# Patient Record
Sex: Female | Born: 1945 | Race: White | Hispanic: No | Smoking: Never smoker
Health system: Southern US, Community
[De-identification: ages and names within clinical notes are randomized; demographics above are authoritative.]

## PROBLEM LIST (undated history)

## (undated) DIAGNOSIS — Z8719 Personal history of other diseases of the digestive system: Secondary | ICD-10-CM

## (undated) DIAGNOSIS — M1990 Unspecified osteoarthritis, unspecified site: Secondary | ICD-10-CM

## (undated) DIAGNOSIS — J189 Pneumonia, unspecified organism: Secondary | ICD-10-CM

## (undated) DIAGNOSIS — R011 Cardiac murmur, unspecified: Secondary | ICD-10-CM

## (undated) DIAGNOSIS — G4730 Sleep apnea, unspecified: Secondary | ICD-10-CM

## (undated) DIAGNOSIS — I10 Essential (primary) hypertension: Secondary | ICD-10-CM

## (undated) DIAGNOSIS — Z8489 Family history of other specified conditions: Secondary | ICD-10-CM

## (undated) DIAGNOSIS — E119 Type 2 diabetes mellitus without complications: Secondary | ICD-10-CM

## (undated) HISTORY — PX: COLONOSCOPY: SHX174

## (undated) HISTORY — PX: APPENDECTOMY: SHX54

## (undated) HISTORY — PX: DILATION AND CURETTAGE OF UTERUS: SHX78

## (undated) HISTORY — PX: BREAST SURGERY: SHX581

---

## 2015-05-07 ENCOUNTER — Other Ambulatory Visit: Payer: Self-pay | Admitting: Orthopedic Surgery

## 2015-05-28 ENCOUNTER — Encounter (HOSPITAL_COMMUNITY)
Admission: RE | Admit: 2015-05-28 | Discharge: 2015-05-28 | Disposition: A | Discharge status: Home / Self Care | Payer: Medicare Other | Source: Ambulatory Visit | Attending: Orthopedic Surgery | Admitting: Orthopedic Surgery

## 2015-05-28 ENCOUNTER — Encounter (HOSPITAL_COMMUNITY): Payer: Self-pay

## 2015-05-28 ENCOUNTER — Ambulatory Visit (HOSPITAL_COMMUNITY)
Admission: RE | Admit: 2015-05-28 | Discharge: 2015-05-28 | Disposition: A | Discharge status: Home / Self Care | Payer: Medicare Other | Source: Ambulatory Visit | Attending: Orthopedic Surgery | Admitting: Orthopedic Surgery

## 2015-05-28 DIAGNOSIS — Z7984 Long term (current) use of oral hypoglycemic drugs: Secondary | ICD-10-CM | POA: Diagnosis not present

## 2015-05-28 DIAGNOSIS — M1711 Unilateral primary osteoarthritis, right knee: Secondary | ICD-10-CM | POA: Insufficient documentation

## 2015-05-28 DIAGNOSIS — M1710 Unilateral primary osteoarthritis, unspecified knee: Secondary | ICD-10-CM

## 2015-05-28 DIAGNOSIS — Z01812 Encounter for preprocedural laboratory examination: Secondary | ICD-10-CM | POA: Diagnosis not present

## 2015-05-28 DIAGNOSIS — Z0183 Encounter for blood typing: Secondary | ICD-10-CM | POA: Diagnosis not present

## 2015-05-28 DIAGNOSIS — Z7982 Long term (current) use of aspirin: Secondary | ICD-10-CM | POA: Insufficient documentation

## 2015-05-28 DIAGNOSIS — Z79899 Other long term (current) drug therapy: Secondary | ICD-10-CM | POA: Diagnosis not present

## 2015-05-28 DIAGNOSIS — Z01818 Encounter for other preprocedural examination: Secondary | ICD-10-CM | POA: Diagnosis present

## 2015-05-28 DIAGNOSIS — M179 Osteoarthritis of knee, unspecified: Secondary | ICD-10-CM

## 2015-05-28 HISTORY — DX: Cardiac murmur, unspecified: R01.1

## 2015-05-28 HISTORY — DX: Unspecified osteoarthritis, unspecified site: M19.90

## 2015-05-28 HISTORY — DX: Personal history of other diseases of the digestive system: Z87.19

## 2015-05-28 HISTORY — DX: Essential (primary) hypertension: I10

## 2015-05-28 HISTORY — DX: Sleep apnea, unspecified: G47.30

## 2015-05-28 HISTORY — DX: Type 2 diabetes mellitus without complications: E11.9

## 2015-05-28 LAB — COMPREHENSIVE METABOLIC PANEL
ALT: 30 U/L (ref 14–54)
ANION GAP: 10 (ref 5–15)
AST: 28 U/L (ref 15–41)
Albumin: 4.1 g/dL (ref 3.5–5.0)
Alkaline Phosphatase: 72 U/L (ref 38–126)
BILIRUBIN TOTAL: 0.5 mg/dL (ref 0.3–1.2)
BUN: 20 mg/dL (ref 6–20)
CO2: 26 mmol/L (ref 22–32)
Calcium: 10 mg/dL (ref 8.9–10.3)
Chloride: 106 mmol/L (ref 101–111)
Creatinine, Ser: 0.78 mg/dL (ref 0.44–1.00)
Glucose, Bld: 112 mg/dL — ABNORMAL HIGH (ref 65–99)
POTASSIUM: 4.3 mmol/L (ref 3.5–5.1)
Sodium: 142 mmol/L (ref 135–145)
TOTAL PROTEIN: 7 g/dL (ref 6.5–8.1)

## 2015-05-28 LAB — TYPE AND SCREEN
ABO/RH(D): O POS
Antibody Screen: NEGATIVE

## 2015-05-28 LAB — CBC
HEMATOCRIT: 47.6 % — AB (ref 36.0–46.0)
Hemoglobin: 15.7 g/dL — ABNORMAL HIGH (ref 12.0–15.0)
MCH: 29 pg (ref 26.0–34.0)
MCHC: 33 g/dL (ref 30.0–36.0)
MCV: 87.8 fL (ref 78.0–100.0)
Platelets: 184 10*3/uL (ref 150–400)
RBC: 5.42 MIL/uL — ABNORMAL HIGH (ref 3.87–5.11)
RDW: 13.7 % (ref 11.5–15.5)
WBC: 5.5 10*3/uL (ref 4.0–10.5)

## 2015-05-28 LAB — SURGICAL PCR SCREEN
MRSA, PCR: NEGATIVE
Staphylococcus aureus: NEGATIVE

## 2015-05-28 LAB — ABO/RH: ABO/RH(D): O POS

## 2015-05-28 LAB — PROTIME-INR
INR: 1.06 (ref 0.00–1.49)
Prothrombin Time: 14 seconds (ref 11.6–15.2)

## 2015-05-28 LAB — APTT: aPTT: 28 seconds (ref 24–37)

## 2015-05-28 LAB — GLUCOSE, CAPILLARY: GLUCOSE-CAPILLARY: 96 mg/dL (ref 65–99)

## 2015-05-28 NOTE — Pre-Procedure Instructions (Signed)
    Natalie PaciniSuzanne C Whitney  05/28/2015      [facility] [place] - [address] [place] [id number] Phone: [phone] Fax: [phone]    Your procedure is scheduled on 06/06/15.  [facility].M[facility]  Call this number if you have problems the morning of surgery:  [phone]   Remember:  Do not eat food or drink liquids after midnight.  Take these medicines the morning of surgery with A SIP OF WATER --none   Do not wear jewelry, make-up or nail polish.  Do not wear lotions, powders, or perfumes.  You may wear deodorant.  Do not shave 48 hours prior to surgery.  Men may shave face and neck.  Do not bring valuables to the hospital.  [facility] is not responsible for any belongings or valuables.  Contacts, dentures or bridgework may not be worn into surgery.  Leave your suitcase in the car.  After surgery it may be brought to your room.  For patients admitted to the hospital, discharge time will be determined by your treatment team.  Patients discharged the day of surgery will not be allowed to drive home.   Name and phone number of your driver:    Special instructions:   Please read over the following fact sheets that you were given. Pain Booklet, Coughing and Deep Breathing, Blood Transfusion Information, MRSA Information and Surgical Site Infection Prevention

## 2015-05-29 NOTE — Progress Notes (Addendum)
Anesthesia Chart Review:  Pt is a 70 year old female scheduled for R total knee arthroplasty on 06/06/2015 with Dr. Lajoyce Cornersuda.   Cardiologist is Dr. Daryel NovemberGary Miller at [facility] in [place], [place].   PMH includes:  ASA, lipitor, canagliflozin, iron, lisinopril, metformin  Preoperative labs reviewed.    Chest x-ray 05/28/15 reviewed. No active cardiopulmonary disease.   EKG 04/05/15: sinus rhythm, 1st degree AV block.  Echo 04/26/14:  1. LA is dilated 2. Mild LVH 3. Normal LV contraction. EF 60-65% 4. Mild MR and TR.   If no changes, I anticipate pt can proceed with surgery as scheduled.   Rica Mastngela Brinna Divelbiss, FNP-BC [facility]/Anesthesiology Phone: [phone] 05/29/2015 4:28 PM

## 2015-06-05 MED ORDER — CEFAZOLIN SODIUM-DEXTROSE 2-4 GM/100ML-% IV SOLN
2.0000 g | INTRAVENOUS | Status: AC
  Administered 2015-06-06: 2 g via INTRAVENOUS
  Filled 2015-06-05: qty 100

## 2015-06-05 NOTE — Anesthesia Preprocedure Evaluation (Addendum)
Anesthesia Evaluation  Patient identified by MRN, date of birth, ID band Patient awake    Reviewed: Allergy & Precautions, NPO status , Patient's Chart, lab work & pertinent test results  History of Anesthesia Complications Negative for: history of anesthetic complications  Airway Mallampati: II  TM Distance: >3 FB Neck ROM: Full    Dental no notable dental hx. (+) Dental Advisory Given, Poor Dentition   Pulmonary sleep apnea ,    Pulmonary exam normal breath sounds clear to auscultation       Cardiovascular hypertension, Pt. on medications Normal cardiovascular exam Rhythm:Regular Rate:Normal     Neuro/Psych negative neurological ROS  negative psych ROS   GI/Hepatic negative GI ROS, Neg liver ROS,   Endo/Other  diabetesobesity  Renal/GU negative Renal ROS  negative genitourinary   Musculoskeletal  (+) Arthritis ,   Abdominal   Peds negative pediatric ROS (+)  Hematology negative hematology ROS (+)   Anesthesia Other Findings   Reproductive/Obstetrics negative OB ROS                            Anesthesia Physical Anesthesia Plan  ASA: II  Anesthesia Plan: Spinal   Post-op Pain Management:    Induction:   Airway Management Planned:   Additional Equipment:   Intra-op Plan:   Post-operative Plan:   Informed Consent: I have reviewed the patients History and Physical, chart, labs and discussed the procedure including the risks, benefits and alternatives for the proposed anesthesia with the patient or authorized representative who has indicated his/her understanding and acceptance.   Dental advisory given  Plan Discussed with: CRNA  Anesthesia Plan Comments:         Anesthesia Quick Evaluation

## 2015-06-06 ENCOUNTER — Inpatient Hospital Stay (HOSPITAL_COMMUNITY)
Admission: RE | Admit: 2015-06-06 | Discharge: 2015-06-09 | DRG: 470 | Disposition: A | Discharge status: Skilled Nursing Facility | Payer: Medicare Other | Source: Ambulatory Visit | Attending: Orthopedic Surgery | Admitting: Orthopedic Surgery

## 2015-06-06 ENCOUNTER — Inpatient Hospital Stay (HOSPITAL_COMMUNITY): Payer: Medicare Other | Admitting: Anesthesiology

## 2015-06-06 ENCOUNTER — Encounter (HOSPITAL_COMMUNITY): Payer: Self-pay | Admitting: *Deleted

## 2015-06-06 ENCOUNTER — Inpatient Hospital Stay (HOSPITAL_COMMUNITY): Payer: Medicare Other | Admitting: Emergency Medicine

## 2015-06-06 ENCOUNTER — Encounter (HOSPITAL_COMMUNITY)
Admission: RE | Disposition: A | Discharge status: Skilled Nursing Facility | Payer: Self-pay | Source: Ambulatory Visit | Attending: Orthopedic Surgery

## 2015-06-06 DIAGNOSIS — Z9049 Acquired absence of other specified parts of digestive tract: Secondary | ICD-10-CM

## 2015-06-06 DIAGNOSIS — M1711 Unilateral primary osteoarthritis, right knee: Secondary | ICD-10-CM | POA: Diagnosis present

## 2015-06-06 DIAGNOSIS — K449 Diaphragmatic hernia without obstruction or gangrene: Secondary | ICD-10-CM | POA: Diagnosis present

## 2015-06-06 DIAGNOSIS — I10 Essential (primary) hypertension: Secondary | ICD-10-CM | POA: Diagnosis present

## 2015-06-06 DIAGNOSIS — Z7984 Long term (current) use of oral hypoglycemic drugs: Secondary | ICD-10-CM

## 2015-06-06 DIAGNOSIS — R011 Cardiac murmur, unspecified: Secondary | ICD-10-CM | POA: Diagnosis present

## 2015-06-06 DIAGNOSIS — Z880 Allergy status to penicillin: Secondary | ICD-10-CM

## 2015-06-06 DIAGNOSIS — G4730 Sleep apnea, unspecified: Secondary | ICD-10-CM | POA: Diagnosis present

## 2015-06-06 DIAGNOSIS — Z96659 Presence of unspecified artificial knee joint: Secondary | ICD-10-CM

## 2015-06-06 DIAGNOSIS — E119 Type 2 diabetes mellitus without complications: Secondary | ICD-10-CM | POA: Diagnosis present

## 2015-06-06 DIAGNOSIS — Z96651 Presence of right artificial knee joint: Secondary | ICD-10-CM

## 2015-06-06 DIAGNOSIS — Z79899 Other long term (current) drug therapy: Secondary | ICD-10-CM | POA: Diagnosis not present

## 2015-06-06 HISTORY — DX: Family history of other specified conditions: Z84.89

## 2015-06-06 HISTORY — PX: TOTAL KNEE ARTHROPLASTY: SHX125

## 2015-06-06 LAB — GLUCOSE, CAPILLARY
GLUCOSE-CAPILLARY: 116 mg/dL — AB (ref 65–99)
GLUCOSE-CAPILLARY: 117 mg/dL — AB (ref 65–99)
GLUCOSE-CAPILLARY: 156 mg/dL — AB (ref 65–99)
Glucose-Capillary: 87 mg/dL (ref 65–99)

## 2015-06-06 SURGERY — ARTHROPLASTY, KNEE, TOTAL
Anesthesia: Spinal | Laterality: Right

## 2015-06-06 MED ORDER — BUPIVACAINE IN DEXTROSE 0.75-8.25 % IT SOLN
INTRATHECAL | Status: DC | PRN
  Administered 2015-06-06: 2 mL via INTRATHECAL

## 2015-06-06 MED ORDER — PROPOFOL 1000 MG/100ML IV EMUL
INTRAVENOUS | Status: AC
  Filled 2015-06-06: qty 200

## 2015-06-06 MED ORDER — LACTATED RINGERS IV SOLN
INTRAVENOUS | Status: DC
  Administered 2015-06-06 (×2): via INTRAVENOUS

## 2015-06-06 MED ORDER — POLYETHYLENE GLYCOL 3350 17 G PO PACK: 17.0000 g | PACK | Freq: Every day | ORAL | Status: DC | PRN

## 2015-06-06 MED ORDER — OXYCODONE HCL 5 MG PO TABS
ORAL_TABLET | ORAL | Status: AC
  Filled 2015-06-06: qty 2

## 2015-06-06 MED ORDER — ROCURONIUM BROMIDE 50 MG/5ML IV SOLN
INTRAVENOUS | Status: AC
  Filled 2015-06-06: qty 1

## 2015-06-06 MED ORDER — LISINOPRIL 20 MG PO TABS
20.0000 mg | ORAL_TABLET | ORAL | Status: DC
  Administered 2015-06-06 – 2015-06-09 (×4): 20 mg via ORAL
  Filled 2015-06-06 (×4): qty 1

## 2015-06-06 MED ORDER — CEFAZOLIN SODIUM 1-5 GM-% IV SOLN
1.0000 g | Freq: Four times a day (QID) | INTRAVENOUS | Status: AC
  Administered 2015-06-06 – 2015-06-07 (×2): 1 g via INTRAVENOUS
  Filled 2015-06-06 (×2): qty 50

## 2015-06-06 MED ORDER — METOCLOPRAMIDE HCL 5 MG PO TABS: 5.0000 mg | ORAL_TABLET | Freq: Three times a day (TID) | ORAL | Status: DC | PRN

## 2015-06-06 MED ORDER — ATORVASTATIN CALCIUM 20 MG PO TABS
20.0000 mg | ORAL_TABLET | Freq: Every evening | ORAL | Status: DC
  Administered 2015-06-06 – 2015-06-08 (×3): 20 mg via ORAL
  Filled 2015-06-06 (×3): qty 1

## 2015-06-06 MED ORDER — PROPOFOL 10 MG/ML IV BOLUS
INTRAVENOUS | Status: AC
  Filled 2015-06-06: qty 20

## 2015-06-06 MED ORDER — SODIUM CHLORIDE 0.9 % IV SOLN
INTRAVENOUS | Status: DC
  Administered 2015-06-06: 16:00:00 via INTRAVENOUS

## 2015-06-06 MED ORDER — BISACODYL 10 MG RE SUPP: 10.0000 mg | Freq: Every day | RECTAL | Status: DC | PRN

## 2015-06-06 MED ORDER — LIDOCAINE HCL (CARDIAC) 20 MG/ML IV SOLN
INTRAVENOUS | Status: AC
  Filled 2015-06-06: qty 5

## 2015-06-06 MED ORDER — EPHEDRINE SULFATE 50 MG/ML IJ SOLN
INTRAMUSCULAR | Status: AC
Start: 2015-06-06 — End: 2015-06-06
  Filled 2015-06-06: qty 1

## 2015-06-06 MED ORDER — ONDANSETRON HCL 4 MG/2ML IJ SOLN
INTRAMUSCULAR | Status: AC
  Filled 2015-06-06: qty 2

## 2015-06-06 MED ORDER — OXYCODONE HCL 5 MG PO TABS
5.0000 mg | ORAL_TABLET | ORAL | Status: DC | PRN
  Administered 2015-06-06 – 2015-06-09 (×18): 10 mg via ORAL
  Filled 2015-06-06 (×18): qty 2

## 2015-06-06 MED ORDER — SODIUM CHLORIDE 0.9 % IV SOLN
2000.0000 mg | INTRAVENOUS | Status: AC
  Administered 2015-06-06: 2000 mg via TOPICAL
  Filled 2015-06-06: qty 20

## 2015-06-06 MED ORDER — SODIUM CHLORIDE 0.9 % IR SOLN
Status: DC | PRN
  Administered 2015-06-06: 1000 mL

## 2015-06-06 MED ORDER — BUPIVACAINE LIPOSOME 1.3 % IJ SUSP
20.0000 mL | INTRAMUSCULAR | Status: AC
  Administered 2015-06-06: 20 mL
  Filled 2015-06-06: qty 20

## 2015-06-06 MED ORDER — ONDANSETRON HCL 4 MG/2ML IJ SOLN
4.0000 mg | Freq: Four times a day (QID) | INTRAMUSCULAR | Status: DC | PRN
  Administered 2015-06-06 – 2015-06-07 (×2): 4 mg via INTRAVENOUS
  Filled 2015-06-06 (×2): qty 2

## 2015-06-06 MED ORDER — MIDAZOLAM HCL 2 MG/2ML IJ SOLN
INTRAMUSCULAR | Status: AC
Start: 2015-06-06 — End: 2015-06-06
  Filled 2015-06-06: qty 2

## 2015-06-06 MED ORDER — METHOCARBAMOL 500 MG PO TABS
ORAL_TABLET | ORAL | Status: AC
Start: 2015-06-06 — End: 2015-06-07
  Filled 2015-06-06: qty 1

## 2015-06-06 MED ORDER — HYDROMORPHONE HCL 1 MG/ML IJ SOLN
1.0000 mg | INTRAMUSCULAR | Status: DC | PRN
  Administered 2015-06-06 – 2015-06-07 (×3): 1 mg via INTRAVENOUS
  Filled 2015-06-06 (×3): qty 1

## 2015-06-06 MED ORDER — DOCUSATE SODIUM 100 MG PO CAPS
100.0000 mg | ORAL_CAPSULE | Freq: Two times a day (BID) | ORAL | Status: DC
  Administered 2015-06-06 – 2015-06-09 (×6): 100 mg via ORAL
  Filled 2015-06-06 (×6): qty 1

## 2015-06-06 MED ORDER — SUCCINYLCHOLINE CHLORIDE 20 MG/ML IJ SOLN
INTRAMUSCULAR | Status: AC
  Filled 2015-06-06: qty 1

## 2015-06-06 MED ORDER — MENTHOL 3 MG MT LOZG: 1.0000 | LOZENGE | OROMUCOSAL | Status: DC | PRN

## 2015-06-06 MED ORDER — KETOROLAC TROMETHAMINE 15 MG/ML IJ SOLN
INTRAMUSCULAR | Status: AC
  Administered 2015-06-06: 7.5 mg via INTRAVENOUS
  Filled 2015-06-06: qty 1

## 2015-06-06 MED ORDER — MAGNESIUM CITRATE PO SOLN: 1.0000 | Freq: Once | ORAL | Status: DC | PRN

## 2015-06-06 MED ORDER — CANAGLIFLOZIN 300 MG PO TABS
300.0000 mg | ORAL_TABLET | Freq: Every day | ORAL | Status: DC
  Administered 2015-06-07 – 2015-06-09 (×3): 300 mg via ORAL
  Filled 2015-06-06 (×3): qty 1

## 2015-06-06 MED ORDER — KETOROLAC TROMETHAMINE 15 MG/ML IJ SOLN
7.5000 mg | Freq: Four times a day (QID) | INTRAMUSCULAR | Status: AC
  Administered 2015-06-06 – 2015-06-07 (×4): 7.5 mg via INTRAVENOUS
  Filled 2015-06-06 (×3): qty 1

## 2015-06-06 MED ORDER — FENTANYL CITRATE (PF) 100 MCG/2ML IJ SOLN
25.0000 ug | INTRAMUSCULAR | Status: DC | PRN
  Administered 2015-06-06: 50 ug via INTRAVENOUS

## 2015-06-06 MED ORDER — FENTANYL CITRATE (PF) 100 MCG/2ML IJ SOLN
INTRAMUSCULAR | Status: AC
  Filled 2015-06-06: qty 2

## 2015-06-06 MED ORDER — ASPIRIN EC 325 MG PO TBEC
325.0000 mg | DELAYED_RELEASE_TABLET | Freq: Every day | ORAL | Status: DC
  Administered 2015-06-07 – 2015-06-09 (×3): 325 mg via ORAL
  Filled 2015-06-06 (×3): qty 1

## 2015-06-06 MED ORDER — ONDANSETRON HCL 4 MG/2ML IJ SOLN: 4.0000 mg | Freq: Once | INTRAMUSCULAR | Status: DC | PRN

## 2015-06-06 MED ORDER — MIDAZOLAM HCL 5 MG/5ML IJ SOLN
INTRAMUSCULAR | Status: DC | PRN
  Administered 2015-06-06: 2 mg via INTRAVENOUS

## 2015-06-06 MED ORDER — CHLORHEXIDINE GLUCONATE 4 % EX LIQD: 60.0000 mL | Freq: Once | CUTANEOUS | Status: DC

## 2015-06-06 MED ORDER — PHENOL 1.4 % MT LIQD: 1.0000 | OROMUCOSAL | Status: DC | PRN

## 2015-06-06 MED ORDER — PROPOFOL 500 MG/50ML IV EMUL
INTRAVENOUS | Status: DC | PRN
  Administered 2015-06-06: 100 ug/kg/min via INTRAVENOUS

## 2015-06-06 MED ORDER — METFORMIN HCL 850 MG PO TABS
850.0000 mg | ORAL_TABLET | Freq: Two times a day (BID) | ORAL | Status: DC
  Administered 2015-06-07 – 2015-06-09 (×5): 850 mg via ORAL
  Filled 2015-06-06 (×6): qty 1

## 2015-06-06 MED ORDER — METHOCARBAMOL 500 MG PO TABS
500.0000 mg | ORAL_TABLET | Freq: Four times a day (QID) | ORAL | Status: DC | PRN
  Administered 2015-06-06 – 2015-06-09 (×8): 500 mg via ORAL
  Filled 2015-06-06 (×8): qty 1

## 2015-06-06 MED ORDER — METOCLOPRAMIDE HCL 5 MG/ML IJ SOLN
5.0000 mg | Freq: Three times a day (TID) | INTRAMUSCULAR | Status: DC | PRN
  Administered 2015-06-07: 10 mg via INTRAVENOUS
  Filled 2015-06-06: qty 2

## 2015-06-06 MED ORDER — FENTANYL CITRATE (PF) 100 MCG/2ML IJ SOLN
INTRAMUSCULAR | Status: DC | PRN
  Administered 2015-06-06: 50 ug via INTRAVENOUS

## 2015-06-06 MED ORDER — INSULIN ASPART 100 UNIT/ML ~~LOC~~ SOLN
0.0000 [IU] | Freq: Three times a day (TID) | SUBCUTANEOUS | Status: DC
  Administered 2015-06-07 – 2015-06-09 (×3): 2 [IU] via SUBCUTANEOUS

## 2015-06-06 MED ORDER — FENTANYL CITRATE (PF) 100 MCG/2ML IJ SOLN: 25.0000 ug | INTRAMUSCULAR | Status: DC | PRN

## 2015-06-06 MED ORDER — 0.9 % SODIUM CHLORIDE (POUR BTL) OPTIME
TOPICAL | Status: DC | PRN
  Administered 2015-06-06: 1000 mL

## 2015-06-06 MED ORDER — ACETAMINOPHEN 650 MG RE SUPP: 650.0000 mg | Freq: Four times a day (QID) | RECTAL | Status: DC | PRN

## 2015-06-06 MED ORDER — PHENYLEPHRINE HCL 10 MG/ML IJ SOLN
INTRAMUSCULAR | Status: DC | PRN
  Administered 2015-06-06 (×2): 80 ug via INTRAVENOUS

## 2015-06-06 MED ORDER — METHOCARBAMOL 1000 MG/10ML IJ SOLN
500.0000 mg | Freq: Four times a day (QID) | INTRAVENOUS | Status: DC | PRN
  Filled 2015-06-06: qty 5

## 2015-06-06 MED ORDER — ACETAMINOPHEN 325 MG PO TABS
650.0000 mg | ORAL_TABLET | Freq: Four times a day (QID) | ORAL | Status: DC | PRN
  Administered 2015-06-07 – 2015-06-09 (×4): 650 mg via ORAL
  Filled 2015-06-06 (×4): qty 2

## 2015-06-06 MED ORDER — STERILE WATER FOR INJECTION IJ SOLN
INTRAMUSCULAR | Status: AC
  Filled 2015-06-06: qty 10

## 2015-06-06 MED ORDER — ARTIFICIAL TEARS OP OINT
TOPICAL_OINTMENT | OPHTHALMIC | Status: AC
  Filled 2015-06-06: qty 3.5

## 2015-06-06 MED ORDER — ONDANSETRON HCL 4 MG PO TABS: 4.0000 mg | ORAL_TABLET | Freq: Four times a day (QID) | ORAL | Status: DC | PRN

## 2015-06-06 MED ORDER — FENTANYL CITRATE (PF) 250 MCG/5ML IJ SOLN
INTRAMUSCULAR | Status: AC
  Filled 2015-06-06: qty 5

## 2015-06-06 SURGICAL SUPPLY — 56 items
BLADE SAG 18X100X1.27 (BLADE) ×3 IMPLANT
BLADE SAGITTAL 25.0X1.27X90 (BLADE) ×2 IMPLANT
BLADE SAGITTAL 25.0X1.27X90MM (BLADE) ×1
BLADE SURG 21 STRL SS (BLADE) ×6 IMPLANT
BNDG COHESIVE 6X5 TAN STRL LF (GAUZE/BANDAGES/DRESSINGS) ×6 IMPLANT
BONE CEMENT PALACOSE (Orthopedic Implant) ×3 IMPLANT
BOWL SMART MIX CTS (DISPOSABLE) ×3 IMPLANT
CAPT KNEE TOTAL 3 ATTUNE ×3 IMPLANT
CEMENT BONE PALACOSE (Orthopedic Implant) ×1 IMPLANT
COVER SURGICAL LIGHT HANDLE (MISCELLANEOUS) ×6 IMPLANT
CUFF TOURNIQUET SINGLE 34IN LL (TOURNIQUET CUFF) ×3 IMPLANT
CUFF TOURNIQUET SINGLE 44IN (TOURNIQUET CUFF) IMPLANT
DRAPE EXTREMITY T 121X128X90 (DRAPE) ×3 IMPLANT
DRAPE PROXIMA HALF (DRAPES) ×3 IMPLANT
DRAPE U-SHAPE 47X51 STRL (DRAPES) ×3 IMPLANT
DRSG ADAPTIC 3X8 NADH LF (GAUZE/BANDAGES/DRESSINGS) ×3 IMPLANT
DRSG PAD ABDOMINAL 8X10 ST (GAUZE/BANDAGES/DRESSINGS) ×3 IMPLANT
DURAPREP 26ML APPLICATOR (WOUND CARE) ×3 IMPLANT
ELECT REM PT RETURN 9FT ADLT (ELECTROSURGICAL) ×3
ELECTRODE REM PT RTRN 9FT ADLT (ELECTROSURGICAL) ×1 IMPLANT
FACESHIELD WRAPAROUND (MASK) ×3 IMPLANT
GAUZE SPONGE 4X4 12PLY STRL (GAUZE/BANDAGES/DRESSINGS) ×3 IMPLANT
GLOVE BIO SURGEON STRL SZ7 (GLOVE) ×3 IMPLANT
GLOVE BIOGEL M STER SZ 6 (GLOVE) ×6 IMPLANT
GLOVE BIOGEL PI IND STRL 6.5 (GLOVE) ×3 IMPLANT
GLOVE BIOGEL PI IND STRL 9 (GLOVE) ×1 IMPLANT
GLOVE BIOGEL PI INDICATOR 6.5 (GLOVE) ×6
GLOVE BIOGEL PI INDICATOR 9 (GLOVE) ×2
GLOVE SURG ORTHO 9.0 STRL STRW (GLOVE) ×3 IMPLANT
GOWN STRL REUS W/ TWL LRG LVL3 (GOWN DISPOSABLE) ×2 IMPLANT
GOWN STRL REUS W/ TWL XL LVL3 (GOWN DISPOSABLE) ×2 IMPLANT
GOWN STRL REUS W/TWL LRG LVL3 (GOWN DISPOSABLE) ×4
GOWN STRL REUS W/TWL XL LVL3 (GOWN DISPOSABLE) ×4
HANDPIECE INTERPULSE COAX TIP (DISPOSABLE) ×2
KIT BASIN OR (CUSTOM PROCEDURE TRAY) ×3 IMPLANT
KIT ROOM TURNOVER OR (KITS) ×3 IMPLANT
MANIFOLD NEPTUNE II (INSTRUMENTS) ×3 IMPLANT
NEEDLE SPNL 18GX3.5 QUINCKE PK (NEEDLE) ×3 IMPLANT
NS IRRIG 1000ML POUR BTL (IV SOLUTION) ×3 IMPLANT
PACK TOTAL JOINT (CUSTOM PROCEDURE TRAY) ×3 IMPLANT
PACK UNIVERSAL I (CUSTOM PROCEDURE TRAY) ×3 IMPLANT
PAD ARMBOARD 7.5X6 YLW CONV (MISCELLANEOUS) ×3 IMPLANT
PADDING CAST COTTON 6X4 STRL (CAST SUPPLIES) ×3 IMPLANT
SET HNDPC FAN SPRY TIP SCT (DISPOSABLE) ×1 IMPLANT
STAPLER VISISTAT 35W (STAPLE) ×3 IMPLANT
SUCTION FRAZIER HANDLE 10FR (MISCELLANEOUS)
SUCTION TUBE FRAZIER 10FR DISP (MISCELLANEOUS) IMPLANT
SUT VIC AB 0 CT1 27 (SUTURE) ×2
SUT VIC AB 0 CT1 27XBRD ANBCTR (SUTURE) ×1 IMPLANT
SUT VIC AB 1 CTX 36 (SUTURE)
SUT VIC AB 1 CTX36XBRD ANBCTR (SUTURE) IMPLANT
SYR 50ML LL SCALE MARK (SYRINGE) ×3 IMPLANT
TOWEL OR 17X24 6PK STRL BLUE (TOWEL DISPOSABLE) ×3 IMPLANT
TOWEL OR 17X26 10 PK STRL BLUE (TOWEL DISPOSABLE) ×3 IMPLANT
TRAY FOLEY CATH 16FRSI W/METER (SET/KITS/TRAYS/PACK) IMPLANT
WRAP KNEE MAXI GEL POST OP (GAUZE/BANDAGES/DRESSINGS) ×3 IMPLANT

## 2015-06-06 NOTE — H&P (Signed)
TOTAL KNEE ADMISSION H&P  Patient is being admitted for right total knee arthroplasty.  Subjective:  Chief Complaint:right knee pain.  HPI: Natalie Whitney, 70 y.o. female, has a history of pain and functional disability in the right knee due to arthritis and has failed non-surgical conservative treatments for greater than 12 weeks to includeNSAID's and/or analgesics, corticosteriod injections, use of assistive devices and activity modification.  Onset of symptoms was gradual, starting 8 years ago with gradually worsening course since that time. The patient noted no past surgery on the right knee(s).  Patient currently rates pain in the right knee(s) at 8 out of 10 with activity. Patient has night pain, worsening of pain with activity and weight bearing, pain that interferes with activities of daily living, pain with passive range of motion, crepitus and joint swelling.  Patient has evidence of subchondral cysts, subchondral sclerosis, periarticular osteophytes, joint subluxation and joint space narrowing by imaging studies. This patient has had avascular necrosis of the knee. There is no active infection.  There are no active problems to display for this patient.  Past Medical History  Diagnosis Date  . Hypertension   . Heart murmur   . Diabetes mellitus without complication (HCC)   . Sleep apnea     stopped cpap  . Arthritis   . History of hiatal hernia     Past Surgical History  Procedure Laterality Date  . Appendectomy    . Dilation and curettage of uterus      No prescriptions prior to admission   Allergies  Allergen Reactions  . Penicillins Rash    Was told not to take it per Dr    Social History  Substance Use Topics  . Smoking status: Never Smoker   . Smokeless tobacco: Not on file  . Alcohol Use: No    No family history on file.   Review of Systems  All other systems reviewed and are negative.   Objective:  Physical Exam  Vital signs in last 24 hours:     Labs:   There is no height or weight on file to calculate BMI.   Imaging Review Plain radiographs demonstrate moderate degenerative joint disease of the right knee(s). The overall alignment ismild varus. The bone quality appears to be adequate for age and reported activity level.  Assessment/Plan:  End stage arthritis, right knee   The patient history, physical examination, clinical judgment of the provider and imaging studies are consistent with end stage degenerative joint disease of the right knee(s) and total knee arthroplasty is deemed medically necessary. The treatment options including medical management, injection therapy arthroscopy and arthroplasty were discussed at length. The risks and benefits of total knee arthroplasty were presented and reviewed. The risks due to aseptic loosening, infection, stiffness, patella tracking problems, thromboembolic complications and other imponderables were discussed. The patient acknowledged the explanation, agreed to proceed with the plan and consent was signed. Patient is being admitted for inpatient treatment for surgery, pain control, PT, OT, prophylactic antibiotics, VTE prophylaxis, progressive ambulation and ADL's and discharge planning. The patient is planning to be discharged home with home health services

## 2015-06-06 NOTE — Transfer of Care (Signed)
Immediate Anesthesia Transfer of Care Note  Patient: Natalie Whitney  Procedure(s) Performed: Procedure(s): TOTAL KNEE ARTHROPLASTY (Right)  Patient Location: PACU  Anesthesia Type:Spinal  Level of Consciousness: awake, alert  and oriented  Airway & Oxygen Therapy: Patient Spontanous Breathing and Patient connected to nasal cannula oxygen  Post-op Assessment: Report given to RN and Post -op Vital signs reviewed and stable  Post vital signs: Reviewed and stable  Last Vitals:  Filed Vitals:   06/06/15 0730  BP: 137/74  Pulse: 65  Temp: 36.4 C  Resp: 20    Complications: No apparent anesthesia complications

## 2015-06-06 NOTE — Discharge Instructions (Signed)

## 2015-06-06 NOTE — Anesthesia Procedure Notes (Addendum)
Procedure Name: MAC Performed by: Margaree MackintoshYACOUB, ALISHA B Pre-anesthesia Checklist: Patient identified, Emergency Drugs available, Suction available, Patient being monitored and Timeout performed Patient Re-evaluated:Patient Re-evaluated prior to inductionOxygen Delivery Method: Simple face mask Preoxygenation: Pre-oxygenation with 100% oxygen Intubation Type: IV induction Placement Confirmation: positive ETCO2 Dental Injury: Teeth and Oropharynx as per pre-operative assessment    Spinal Patient location during procedure: OR Staffing Anesthesiologist: Annalynne Ibanez Performed by: anesthesiologist  Preanesthetic Checklist Completed: patient identified, site marked, surgical consent, pre-op evaluation, timeout performed, IV checked, risks and benefits discussed and monitors and equipment checked Spinal Block Patient position: sitting Prep: DuraPrep Patient monitoring: continuous pulse ox, blood pressure and heart rate Approach: midline Location: L3-4 Injection technique: single-shot Needle Needle type: Sprotte  Needle gauge: 24 G Needle length: 9 cm Additional Notes Functioning IV was confirmed and monitors were applied. Sterile prep and drape, including hand hygiene and sterile gloves were used. The patient was positioned and the spine was prepped. The skin was anesthetized with lidocaine.  Free flow of clear CSF was obtained prior to injecting local anesthetic into the CSF.  The spinal needle aspirated freely following injection.  The needle was carefully withdrawn.  The patient tolerated the procedure well.  Karie SchwalbeMary Maddelynn Moosman, MD

## 2015-06-06 NOTE — Op Note (Signed)
06/06/2015  10:25 AM  PATIENT:  [name]    PRE-OPERATIVE DIAGNOSIS:  Osteoarthritis Right Knee  POST-OPERATIVE DIAGNOSIS:  Same  PROCEDURE:  TOTAL KNEE ARTHROPLASTY  SURGEON:  Nadara MustardUDA,Cai Anfinson V, MD  PHYSICIAN ASSISTANT:None ANESTHESIA:   General  PREOPERATIVE INDICATIONS:  [name] is a  70 y.o. female with a diagnosis of Osteoarthritis Right Knee who failed conservative measures and elected for surgical management.    The risks benefits and alternatives were discussed with the patient preoperatively including but not limited to the risks of infection, bleeding, nerve injury, cardiopulmonary complications, the need for revision surgery, among others, and the patient was willing to proceed.  OPERATIVE IMPLANTS: [place] implants with size 5 tibia, size 5 femur,  5 mm polyethylene tray, 32 mm patella. Knee injected with 20 mL X Burrell  And topically soaked with trans-Amick acid  OPERATIVE FINDINGS: stable alignment patella tracked midline  OPERATIVE PROCEDURE: patient brought to the operating room and underwent a spinal anesthetic. After adequate levels anesthesia obtained patient's right lower extremity was prepped using DuraPrep draped into a sterile field. Collier Flowersoban was used to cover all exposed skin. A timeout was called. A midline incision was made carried down through a medial parapatellar retinacular incision. Intramedullary guide was used to resect 9 mm off the distal femur at 5 of valgus. Attention was then focused on the tibia. The tibia was resected at 5 posterior slope neutral varus and valgus. This sized for a size 5. Attention was focused back on the femur. The femur was sized for size 5 and the cutting blocks and chamfer blocks were used to make the cuts for the femur. The femur and trial tibia were placed lug cuts were made for the femur. The knee was placed through a range of motion the knee was stable with a 5 mm poly-tray. The rotation was marked external alignment was  used to check. The trials were removed the tibia was then broached for the size 5 tibia. The patella was resurfaced and 9 mm was taken off the patella this sized for 32 and the lug cuts were made for size 32 patella. The popliteal fossa was injected with 20 mL of X Burrell. The knee was irrigated with pulsatile lavage. The knee was then soaked with trans-Amick acid. The meniscal tissue was debrided loose bodies were removed. The tibial tray and then polyethylene tray were placed and inserted loose cement was removed. The femur was then inserted and impacted and loose cement was removed. The knee was left in extension until the cement hardened. The patella was clamped and was also left in place until the cement hardened. All loose cement was removed. The knee was then placed through a full range of motion and patella tracked midline no instability varus or valgus. The retinaculum was closed with #1 Vicryl subcutaneous is closed using 0 Vicryl the skin was closed using staples. A sterile compressive dressing was applied patient was taken to the PACU in stable condition.

## 2015-06-06 NOTE — Anesthesia Postprocedure Evaluation (Signed)
Anesthesia Post Note  Patient: Natalie Whitney  Procedure(s) Performed: Procedure(s) (LRB): TOTAL KNEE ARTHROPLASTY (Right)  Patient location during evaluation: PACU Anesthesia Type: Spinal Level of consciousness: awake and alert Pain management: pain level controlled Vital Signs Assessment: post-procedure vital signs reviewed and stable Respiratory status: spontaneous breathing, nonlabored ventilation, respiratory function stable and patient connected to nasal cannula oxygen Cardiovascular status: blood pressure returned to baseline and stable Postop Assessment: no signs of nausea or vomiting and spinal receding Anesthetic complications: no    Last Vitals:  Filed Vitals:   06/06/15 1315 06/06/15 1330  BP: 119/73 118/61  Pulse: 51 55  Temp:    Resp: 13 13    Last Pain: There were no vitals filed for this visit.               Dorean Daniello JENNETTE

## 2015-06-07 ENCOUNTER — Encounter (HOSPITAL_COMMUNITY): Payer: Self-pay | Admitting: Orthopedic Surgery

## 2015-06-07 LAB — GLUCOSE, CAPILLARY
GLUCOSE-CAPILLARY: 131 mg/dL — AB (ref 65–99)
GLUCOSE-CAPILLARY: 134 mg/dL — AB (ref 65–99)
GLUCOSE-CAPILLARY: 101 mg/dL — AB (ref 65–99)
Glucose-Capillary: 129 mg/dL — ABNORMAL HIGH (ref 65–99)

## 2015-06-07 LAB — BASIC METABOLIC PANEL
Anion gap: 9 (ref 5–15)
BUN: 16 mg/dL (ref 6–20)
CHLORIDE: 104 mmol/L (ref 101–111)
CO2: 27 mmol/L (ref 22–32)
Calcium: 9 mg/dL (ref 8.9–10.3)
Creatinine, Ser: 0.77 mg/dL (ref 0.44–1.00)
GFR calc non Af Amer: >60 mL/min (ref >60)
Glucose, Bld: 143 mg/dL — ABNORMAL HIGH (ref 65–99)
POTASSIUM: 4.2 mmol/L (ref 3.5–5.1)
Sodium: 140 mmol/L (ref 135–145)

## 2015-06-07 LAB — HEMOGLOBIN A1C
Hgb A1c MFr Bld: 6.3 % — ABNORMAL HIGH (ref 4.8–5.6)
Mean Plasma Glucose: 134 mg/dL

## 2015-06-07 LAB — CBC
HCT: 40 % (ref 36.0–46.0)
Hemoglobin: 12.8 g/dL (ref 12.0–15.0)
MCH: 27.9 pg (ref 26.0–34.0)
MCHC: 32 g/dL (ref 30.0–36.0)
MCV: 87.1 fL (ref 78.0–100.0)
Platelets: 191 10*3/uL (ref 150–400)
RBC: 4.59 MIL/uL (ref 3.87–5.11)
RDW: 13.8 % (ref 11.5–15.5)
WBC: 8.7 10*3/uL (ref 4.0–10.5)

## 2015-06-07 NOTE — Clinical Social Work Note (Signed)
Clinical Social Work Assessment  Patient Details  Name: Natalie Whitney MRN: [id number] Date of Birth: 04-May-1945  Date of referral:  06/07/15               Reason for consult:  Discharge Planning                Permission sought to share information with:  Chartered certified accountant granted to share information::  Yes, Verbal Permission Granted  Name::        Agency::  [facility]  Relationship::     Contact Information:     Housing/Transportation Living arrangements for the past 2 months:  Single Family Home Source of Information:  Patient Patient Interpreter Needed:  None Criminal Activity/Legal Involvement Pertinent to Current Situation/Hospitalization:  No - Comment as needed Significant Relationships:  None Lives with:  Self Do you feel safe going back to the place where you live?  Yes Need for family participation in patient care:  No (Coment) (Patient able to make own decisions)  Care giving concerns:  Patient expressed no concerns regarding discharge at this time.   Social Worker assessment / plan:  CSW received referral for possible SNF placement at time of discharge. CSW met with patient who informed CSW that patient is pre-registered with [facility]. CSW to continue to follow and assist with discharge planning needs.  Employment status:  Other (Comment) (Did not disclose) Insurance information:  Medicare PT Recommendations:  [place] / Referral to community resources:  [place]  Patient/Family's Response to care:  Patient understanding and agreeable to CSW plan of care.  Patient/Family's Understanding of and Emotional Response to Diagnosis, Current Treatment, and Prognosis:  Patient understanding and agreeable to CSW plan of care.  Emotional Assessment Appearance:  Appears stated age Attitude/Demeanor/Rapport:  Other (Appropriate) Affect (typically observed):  Accepting, Appropriate, Pleasant Orientation:   Oriented to Self, Oriented to Place, Oriented to  Time, Oriented to Situation Alcohol / Substance use:  Not Applicable Psych involvement (Current and /or in the community):  No (Comment) (Not appropriate on this admission.)  Discharge Needs  Concerns to be addressed:  No discharge needs identified Readmission within the last 30 days:  No Current discharge risk:  None Barriers to Discharge:  No Barriers Identified   Caroline Sauger, LCSW 06/07/2015, 4:13 PM [phone]

## 2015-06-07 NOTE — Progress Notes (Signed)
Physical Therapy Treatment Patient Details Name: Natalie Whitney MRN: [id number] DOB: [birth year] Today's Date: 06/07/2015    History of Present Illness 70 y.o. female now s/p Rt TKA. PMH: hypertension, diabetes, heart murmur.     PT Comments    Pt making gradual progress with mobility during PT sessions. Able to increase ambulation distance but continues with antalgic pattern. Continue to anticipate D/C to SNF when medically released. PT to continue to follow.  Follow Up Recommendations  SNF;Supervision for mobility/OOB     Equipment Recommendations  None recommended by PT    Recommendations for Other Services       Precautions / Restrictions Precautions Precautions: Knee;Fall Precaution Booklet Issued: Yes (comment) Precaution Comments: HEP provided Restrictions Weight Bearing Restrictions: Yes RLE Weight Bearing: Weight bearing as tolerated    Mobility  Bed Mobility Overal bed mobility: Needs Assistance Bed Mobility: Sit to Supine       Sit to supine: Mod assist (Rt LE)   General bed mobility comments: up with nursing upon arrival  Transfers Overall transfer level: Needs assistance Equipment used: Rolling walker (2 wheeled) Transfers: Sit to/from Stand Sit to Stand: Min assist         General transfer comment: from chair and toilet  Ambulation/Gait Ambulation/Gait assistance: Min guard Ambulation Distance (Feet): 25 Feet (10' X1, 15' X1) Assistive device: Rolling walker (2 wheeled) Gait Pattern/deviations: Step-to pattern;Decreased stance time - right;Decreased weight shift to right Gait velocity: decreased   General Gait Details: decreasing antalgia with increasing distance but continued limitations. Pt reports being fatigued by end of session.    Stairs            Wheelchair Mobility    Modified Rankin (Stroke Patients Only)       Balance Overall balance assessment: Needs assistance Sitting-balance support: No upper extremity  supported Sitting balance-Leahy Scale: Fair     Standing balance support: Bilateral upper extremity supported Standing balance-Leahy Scale: Poor Standing balance comment: using rw                    Cognition Arousal/Alertness: Awake/alert Behavior During Therapy: WFL for tasks assessed/performed Overall Cognitive Status: Within Functional Limits for tasks assessed                      Exercises Total Joint Exercises Ankle Circles/Pumps: AROM;Both;10 reps Quad Sets: Strengthening;Right;10 reps Heel Slides: AAROM;Right;10 reps Hip ABduction/ADduction: Strengthening;Right;10 reps (mod assist) Goniometric ROM: approximately 40 degrees knee flexion and near 0 degrees extension.     General Comments        Pertinent Vitals/Pain Pain Assessment: 0-10 Pain Score: 6  Pain Location: Rt knee Pain Descriptors / Indicators: Aching Pain Intervention(s): Limited activity within patient's tolerance;Monitored during session    Home Living Family/patient expects to be discharged to:: Skilled nursing facility Living Arrangements: Alone             Additional Comments: reports having about 10 steps to enter her home    Prior Function Level of Independence: Independent          PT Goals (current goals can now be found in the care plan section) Acute Rehab PT Goals Patient Stated Goal: leave for more therapy after the hospital PT Goal Formulation: With patient Time For Goal Achievement: 06/21/15 Potential to Achieve Goals: Good Progress towards PT goals: Progressing toward goals    Frequency  7X/week    PT Plan Current plan remains appropriate    Co-evaluation  End of Session Equipment Utilized During Treatment: Gait belt Activity Tolerance: Patient limited by pain;Patient limited by fatigue Patient left: in bed;with call bell/phone within reach;with family/visitor present;with SCD's reapplied;Other (comment) (in knee extension)      Time: [id number] PT Time Calculation (min) (ACUTE ONLY): 37 min  Charges:  $Gait Training: 8-22 mins $Therapeutic Exercise: 8-22 mins                    G Codes:      Christiane HaBenjamin J. Zalma Channing, PT, CSCS Pager [phone] Office [phone]  06/07/2015, 4:03 PM

## 2015-06-07 NOTE — Evaluation (Signed)
Physical Therapy Evaluation Patient Details Name: Natalie Whitney MRN: [id number] DOB: [birth year] Today's Date: 06/07/2015   History of Present Illness  70 y.o. female now s/p Rt TKA. PMH: hypertension, diabetes, heart murmur.   Clinical Impression  Pt is s/p TKA resulting in the deficits listed below (see PT Problem List).  Pt will benefit from skilled PT to increase their independence and safety with mobility to allow D/C to SNF. Will progress as tolerated.      Follow Up Recommendations SNF;Supervision for mobility/OOB    Equipment Recommendations  Other (comment) (to be addressed at next venue)    Recommendations for Other Services       Precautions / Restrictions Precautions Precautions: Knee;Fall Precaution Comments: reviewed no pillow under knee Restrictions Weight Bearing Restrictions: Yes RLE Weight Bearing: Weight bearing as tolerated      Mobility  Bed Mobility               General bed mobility comments: up with nursing upon arrival  Transfers Overall transfer level: Needs assistance Equipment used: Rolling walker (2 wheeled) Transfers: Sit to/from Stand Sit to Stand: Min assist         General transfer comment: transferred from [facility] and then to chair.   Ambulation/Gait Ambulation/Gait assistance: Min guard Ambulation Distance (Feet): 12 Feet Assistive device: Rolling walker (2 wheeled) Gait Pattern/deviations: Step-to pattern;Decreased stance time - right;Decreased step length - left Gait velocity: decreased   General Gait Details: decreased weight shift onto Rt LE, antalgic pattern.   Stairs            Wheelchair Mobility    Modified Rankin (Stroke Patients Only)       Balance Overall balance assessment: Needs assistance Sitting-balance support: No upper extremity supported Sitting balance-Leahy Scale: Fair     Standing balance support: Bilateral upper extremity supported Standing balance-Leahy Scale: Poor Standing balance  comment: using rw                             Pertinent Vitals/Pain Pain Assessment: 0-10 Pain Score: 7  Pain Location: Rt knee Pain Descriptors / Indicators: Aching Pain Intervention(s): Limited activity within patient's tolerance;Monitored during session    Home Living Family/patient expects to be discharged to:: Skilled nursing facility Living Arrangements: Alone               Additional Comments: reports having about 10 steps to enter her home    Prior Function Level of Independence: Independent               Hand Dominance        Extremity/Trunk Assessment               Lower Extremity Assessment: RLE deficits/detail RLE Deficits / Details: poor quad activation       Communication   Communication: No difficulties  Cognition Arousal/Alertness: Awake/alert Behavior During Therapy: WFL for tasks assessed/performed Overall Cognitive Status: Within Functional Limits for tasks assessed                      General Comments      Exercises Total Joint Exercises Ankle Circles/Pumps: AROM;Both;10 reps Quad Sets: Strengthening;Right;10 reps Heel Slides: AAROM;Right;10 reps Goniometric ROM: approximately 40 degrees knee flexion and near 0 degrees extension.       Assessment/Plan    PT Assessment Patient needs continued PT services  PT Diagnosis Difficulty walking;Acute pain   PT Problem List Decreased  strength;Decreased range of motion;Decreased activity tolerance;Decreased balance;Decreased mobility;Pain  PT Treatment Interventions DME instruction;Gait training;Stair training;Functional mobility training;Therapeutic activities;Therapeutic exercise;Balance training;Patient/family education   PT Goals (Current goals can be found in the Care Plan section) Acute Rehab PT Goals Patient Stated Goal: eventually go home PT Goal Formulation: With patient Time For Goal Achievement: 06/21/15 Potential to Achieve Goals: Good     Frequency 7X/week   Barriers to discharge        Co-evaluation               End of Session Equipment Utilized During Treatment: Gait belt Activity Tolerance: Patient limited by pain;Patient limited by fatigue Patient left: in chair;with call bell/phone within reach;with family/visitor present;Other (comment) (in knee extension stretch) Nurse Communication: Mobility status;Weight bearing status         Time: 4782-95621052-1132 PT Time Calculation (min) (ACUTE ONLY): 40 min   Charges:   PT Evaluation $PT Eval Moderate Complexity: 1 Procedure PT Treatments $Gait Training: 8-22 mins $Therapeutic Exercise: 8-22 mins   PT G Codes:        Natalie Whitney, PT, CSCS Pager [phone] Office [phone]  06/07/2015, 1:27 PM

## 2015-06-07 NOTE — Progress Notes (Signed)
OT Cancellation Note  Patient Details Name: Natalie Whitney MRN: [id number] DOB: 08/28/1945   Cancelled Treatment:    Reason Eval/Treat Not Completed: Other (comment) Pt is Medicare/Medicaid and current D/C plan is SNF. No apparent immediate acute care OT needs, therefore will defer OT to SNF. If OT eval is needed please call Acute Rehab Dept. at [phone] or text page OT at [phone].  [facility],[name]  Cliffton Spradley, OTR/L  [phone] 06/07/2015 06/07/2015, 6:20 AM

## 2015-06-07 NOTE — Clinical Social Work Placement (Signed)
   CLINICAL SOCIAL WORK PLACEMENT  NOTE  Date:  06/07/2015  Patient Details  Name: Natalie Whitney MRN: [id number] Date of Birth: 01/28/1946  Clinical Social Work is seeking post-discharge placement for this patient at the Skilled  Nursing Facility level of care (*CSW will initial, date and re-position this form in  chart as items are completed):  Yes   Patient/family provided with [facility] Clinical Social Work Department's list of facilities offering this level of care within the geographic area requested by the patient (or if unable, by the patient's family).  Yes   Patient/family informed of their freedom to choose among providers that offer the needed level of care, that participate in Medicare, Medicaid or managed care program needed by the patient, have an available bed and are willing to accept the patient.  Yes   Patient/family informed of [facility]'s ownership interest in [facility] and [facility], as well as of the fact that they are under no obligation to receive care at these facilities.  PASRR submitted to EDS on 06/07/15     PASRR number received on       Existing PASRR number confirmed on       FL2 transmitted to all facilities in geographic area requested by pt/family on 06/07/15     FL2 transmitted to all facilities within larger geographic area on       Patient informed that his/her managed care company has contracts with or will negotiate with certain facilities, including the following:        Yes   Patient/family informed of bed offers received.  [facility]     Physician recommends and patient chooses bed at      [facility]  .  Patient to be transferred to facility by [facility]     Patient family notified on   of transfer.  Name of family member notified:        PHYSICIAN Please sign FL2     Additional Comment:     _______________________________________________ Rod MaeVaughn, Khylei Wilms S, LCSW 06/07/2015, 4:15 PM

## 2015-06-07 NOTE — Progress Notes (Signed)
Patient ID: Natalie Whitney, female   DOB: 10/02/1945, 70 y.o.   MRN: [id number] Postoperative day 1 right total knee arthroplasty. Patient states that she is doing well she has been out of the bed. Physical therapy progressive ambulation weightbearing as tolerated she will work on extension exercises. Plan for discharge to skilled nursing Friday.

## 2015-06-07 NOTE — [place] FL2 (Signed)
[place] MEDICAID FL2 LEVEL OF CARE SCREENING TOOL     IDENTIFICATION  Patient Name: Natalie Whitney Birthdate: [birth year] Sex: female Admission Date (Current Location): 06/06/2015  [facility] and [place] Number:  Producer, television/film/video and Address:  [facility] [place], [address], [place], [place] [address]      Provider Number: [id number]  Attending Physician Name and Address:  Nadara Mustard, MD  Relative Name and Phone Number:       Current Level of Care: Hospital Recommended Level of Care: Skilled Nursing Facility Prior Approval Number:    Date Approved/Denied:   PASRR Number:    Discharge Plan: SNF    Current Diagnoses: Patient Active Problem List   Diagnosis Date Noted  . 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    Orientation RESPIRATION BLADDER Height & Weight     Self, Time, Situation, Place  Normal Continent Weight: 213 lb (96.616 kg) Height:   (162.6 cm)  BEHAVIORAL SYMPTOMS/MOOD NEUROLOGICAL BOWEL NUTRITION STATUS      Continent Diet (Please see discharge summary.)  AMBULATORY STATUS COMMUNICATION OF NEEDS Skin   Independent Verbally Surgical wounds                       Personal Care Assistance Level of Assistance  Bathing, Feeding, Dressing Bathing Assistance: Limited assistance Feeding assistance: Limited assistance Dressing Assistance: Limited assistance     Functional Limitations Info             SPECIAL CARE FACTORS FREQUENCY  PT (By licensed PT), OT (By licensed OT)                    Contractures      Additional Factors Info  Code Status, Allergies Code Status Info: Not on file Allergies Info: Penicillins           Current Medications (06/07/2015):  This is the current hospital active medication list Current Facility-Administered Medications  Medication Dose Route Frequency Provider Last Rate Last Dose  . 0.9 %  sodium chloride infusion   Intravenous Continuous Nadara Mustard, MD 10 mL/hr  at 06/06/15 1600    . acetaminophen (TYLENOL) tablet 650 mg  650 mg Oral Q6H PRN Nadara Mustard, MD       Or  . acetaminophen (TYLENOL) suppository 650 mg  650 mg Rectal Q6H PRN Nadara Mustard, MD      . aspirin EC tablet 325 mg  325 mg Oral Q breakfast Nadara Mustard, MD   325 mg at 06/07/15 0742  . atorvastatin (LIPITOR) tablet 20 mg  20 mg Oral QPM Nadara Mustard, MD   20 mg at 06/06/15 1703  . bisacodyl (DULCOLAX) suppository 10 mg  10 mg Rectal Daily PRN Nadara Mustard, MD      . canagliflozin [facility]) tablet 300 mg  300 mg Oral QAC breakfast Nadara Mustard, MD   300 mg at 06/07/15 0743  . docusate sodium (COLACE) capsule 100 mg  100 mg Oral BID Nadara Mustard, MD   100 mg at 06/07/15 0743  . HYDROmorphone (DILAUDID) injection 1 mg  1 mg Intravenous Q2H PRN Nadara Mustard, MD   1 mg at 06/06/15 2328  . insulin aspart (novoLOG) injection 0-15 Units  0-15 Units Subcutaneous TID WC Nadara Mustard, MD   2 Units at 06/07/15 1215  . lisinopril (PRINIVIL,ZESTRIL) tablet 20 mg  20 mg Oral BH-q7a Nadara Mustard,  MD   20 mg at 06/07/15 0743  . magnesium citrate solution 1 Bottle  1 Bottle Oral Once PRN Nadara MustardMarcus Duda V, MD      . menthol-cetylpyridinium (CEPACOL) lozenge 3 mg  1 lozenge Oral PRN Nadara MustardMarcus Duda V, MD       Or  . phenol (CHLORASEPTIC) mouth spray 1 spray  1 spray Mouth/Throat PRN Nadara MustardMarcus Duda V, MD      . metFORMIN (GLUCOPHAGE) tablet 850 mg  850 mg Oral BID WC Nadara MustardMarcus Duda V, MD   850 mg at 06/07/15 0743  . methocarbamol (ROBAXIN) tablet 500 mg  500 mg Oral Q6H PRN Nadara MustardMarcus Duda V, MD   500 mg at 06/07/15 1104   Or  . methocarbamol (ROBAXIN) 500 mg in dextrose 5 % 50 mL IVPB  500 mg Intravenous Q6H PRN Nadara MustardMarcus Duda V, MD      . metoCLOPramide (REGLAN) tablet 5-10 mg  5-10 mg Oral Q8H PRN Nadara MustardMarcus Duda V, MD       Or  . metoCLOPramide (REGLAN) injection 5-10 mg  5-10 mg Intravenous Q8H PRN Nadara MustardMarcus Duda V, MD      . ondansetron [facility](ZOFRAN) tablet 4 mg  4 mg Oral Q6H PRN Nadara MustardMarcus Duda V, MD       Or  .  ondansetron [facility](ZOFRAN) injection 4 mg  4 mg Intravenous Q6H PRN Nadara MustardMarcus Duda V, MD   4 mg at 06/06/15 1738  . oxyCODONE (Oxy IR/ROXICODONE) immediate release tablet 5-10 mg  5-10 mg Oral Q3H PRN Nadara MustardMarcus Duda V, MD   10 mg at 06/07/15 1417  . polyethylene glycol (MIRALAX / GLYCOLAX) packet 17 g  17 g Oral Daily PRN Nadara MustardMarcus Duda V, MD         Discharge Medications: Please see discharge summary for a list of discharge medications.  Relevant Imaging Results:  Relevant Lab Results:   Additional Information SSN: [id number]  Rod MaeVaughn, Josten Warmuth S, LCSW

## 2015-06-08 LAB — CBC
HEMATOCRIT: 37.8 % (ref 36.0–46.0)
HEMOGLOBIN: 12.5 g/dL (ref 12.0–15.0)
MCH: 28.9 pg (ref 26.0–34.0)
MCHC: 33.1 g/dL (ref 30.0–36.0)
MCV: 87.5 fL (ref 78.0–100.0)
Platelets: 199 10*3/uL (ref 150–400)
RBC: 4.32 MIL/uL (ref 3.87–5.11)
RDW: 13.7 % (ref 11.5–15.5)
WBC: 9.9 10*3/uL (ref 4.0–10.5)

## 2015-06-08 LAB — GLUCOSE, CAPILLARY
GLUCOSE-CAPILLARY: 115 mg/dL — AB (ref 65–99)
GLUCOSE-CAPILLARY: 115 mg/dL — AB (ref 65–99)
Glucose-Capillary: 92 mg/dL (ref 65–99)
Glucose-Capillary: 108 mg/dL — ABNORMAL HIGH (ref 65–99)

## 2015-06-08 MED ORDER — OXYCODONE-ACETAMINOPHEN 5-325 MG PO TABS: 1.0000 | ORAL_TABLET | ORAL | Status: DC | PRN

## 2015-06-08 MED ORDER — ASPIRIN EC 325 MG PO TBEC: 325.0000 mg | DELAYED_RELEASE_TABLET | Freq: Every day | ORAL | Status: DC

## 2015-06-08 NOTE — Discharge Summary (Signed)
Physician Discharge Summary  Patient ID: Natalie Whitney MRN: [id number] DOB/AGE: 11-17-1945 70 y.o.  Admit date: 06/06/2015 Discharge date: 06/08/2015  Admission Diagnoses:Osteoarthritis right knee  Discharge Diagnoses:  Active Problems:   Total knee replacement status   Discharged Condition: stable  Hospital Course: Patient's hospital course was essentially unremarkable. She underwent total knee arthroplasty. Postoperatively she progressed slowly and was discharged to skilled nursing to be safe for discharge to home.  Consults: None  Significant Diagnostic Studies: labs: Routine labs  Treatments: surgery: See operative note  Discharge Exam: Blood pressure 155/72, pulse 81, temperature 98.1 F (36.7 C), temperature source Oral, resp. rate 18, height  (1.626 m), weight 96.616 kg (213 lb), SpO2 99 %. Incision/Wound: Dressing clean and dry  Disposition: Final discharge disposition not confirmed  Discharge Instructions    Call MD / Call 911    Complete by:  As directed   If you experience chest pain or shortness of breath, CALL 911 and be transported to the hospital emergency room.  If you develope a fever above 101 F, pus (white drainage) or increased drainage or redness at the wound, or calf pain, call your surgeon's office.     Constipation Prevention    Complete by:  As directed   Drink plenty of fluids.  Prune juice may be helpful.  You may use a stool softener, such as Colace (over the counter) 100 mg twice a day.  Use MiraLax (over the counter) for constipation as needed.     Diet - low sodium heart healthy    Complete by:  As directed      Increase activity slowly as tolerated    Complete by:  As directed      Weight bearing as tolerated    Complete by:  As directed             Medication List    TAKE these medications        ALEVE 220 MG tablet  Generic drug:  naproxen sodium  Take 440 mg by mouth 2 (two) times daily with a meal.     aspirin 81 MG  tablet  Take 81 mg by mouth daily.     aspirin EC 325 MG tablet  Take 1 tablet (325 mg total) by mouth daily.     atorvastatin 20 MG tablet  Commonly known as:  LIPITOR  Take 20 mg by mouth every evening.     BUFFERED VITAMIN C PO  Take 500 mg by mouth daily.     Fish Oil 1000 MG Caps  Take 1 capsule by mouth 2 (two) times daily.     Glucosamine HCl 1000 MG Tabs  Take 1 tablet by mouth 2 (two) times daily.     INVOKANA 300 MG Tabs tablet  Generic drug:  canagliflozin  Take 300 mg by mouth daily before breakfast.     Iron 142 (45 Fe) MG Tbcr  Take 1 tablet by mouth daily.     lisinopril 20 MG tablet  Commonly known as:  PRINIVIL,ZESTRIL  Take 20 mg by mouth every morning.     loratadine 10 MG tablet  Commonly known as:  CLARITIN  Take 10 mg by mouth daily as needed for allergies.     metFORMIN 850 MG tablet  Commonly known as:  GLUCOPHAGE  Take 850 mg by mouth 2 (two) times daily with a meal.     MSM 1000 MG Tabs  Take 1 tablet by mouth 2 (two) times  daily.     oxyCODONE-acetaminophen 5-325 MG tablet  Commonly known as:  ROXICET  Take 1 tablet by mouth every 4 (four) hours as needed for severe pain.     Selenium 100 MCG Caps  Take 1 capsule by mouth 2 (two) times a week. SAT and SUN     vitamin B-12 1000 MCG tablet  Commonly known as:  CYANOCOBALAMIN  Take 1,000 mcg by mouth daily.     Vitamin D3 5000 units Tabs  Take 1 tablet by mouth 2 (two) times daily.     vitamin E 400 UNIT capsule  Take 400 Units by mouth daily.           Follow-up Information    Follow up with DUDA,MARCUS V, MD In 2 weeks.   Specialty:  Orthopedic Surgery   Contact information:   [address] [place] [address] [phone]       Signed: Nadara MustardDUDA,MARCUS V 06/08/2015, 6:26 AM

## 2015-06-08 NOTE — Progress Notes (Signed)
Physical Therapy Treatment Patient Details Name: Natalie PaciniSuzanne C Schaffer MRN: [id number] DOB: 03/16/1945 Today's Date: 06/08/2015    History of Present Illness 70 y.o. female now s/p Rt TKA. PMH: hypertension, diabetes, heart murmur.     PT Comments    Pt making gradual progress with PT. Anticipate D/C to SNF when medically released. PT to follow and progress mobility as tolerated.   Follow Up Recommendations  SNF;Supervision for mobility/OOB     Equipment Recommendations  None recommended by PT    Recommendations for Other Services       Precautions / Restrictions Precautions Precautions: Knee;Fall Restrictions Weight Bearing Restrictions: Yes RLE Weight Bearing: Weight bearing as tolerated    Mobility  Bed Mobility                  Transfers                    Ambulation/Gait                 Stairs            Wheelchair Mobility    Modified Rankin (Stroke Patients Only)       Balance                                    Cognition Arousal/Alertness: Awake/alert Behavior During Therapy: WFL for tasks assessed/performed Overall Cognitive Status: Within Functional Limits for tasks assessed                      Exercises Total Joint Exercises Ankle Circles/Pumps: AROM;Both;10 reps Quad Sets: Strengthening;Right;10 reps Short Arc Quad: Strengthening;Right;10 reps Heel Slides: AAROM;Right;10 reps Hip ABduction/ADduction: Strengthening;Right;10 reps;Other (comment) (min assist) Straight Leg Raises: Strengthening;Right;10 reps;Other (comment) (min assist)    General Comments        Pertinent Vitals/Pain Pain Assessment: 0-10 Pain Score: 5  Pain Location: rt knee Pain Descriptors / Indicators: Aching Pain Intervention(s): Limited activity within patient's tolerance;Monitored during session    Home Living                      Prior Function            PT Goals (current goals can now be found in  the care plan section) Acute Rehab PT Goals PT Goal Formulation: With patient Time For Goal Achievement: 06/21/15 Potential to Achieve Goals: Good Progress towards PT goals: Progressing toward goals    Frequency  7X/week    PT Plan Current plan remains appropriate    Co-evaluation             End of Session   Activity Tolerance: Patient tolerated treatment well Patient left: in bed;with call bell/phone within reach;with family/visitor present;with SCD's reapplied     Time: 7829-56211602-1623 PT Time Calculation (min) (ACUTE ONLY): 21 min  Charges:  $Therapeutic Exercise: 8-22 mins                    G Codes:      Christiane HaBenjamin J. Kendrik Mcshan, PT, CSCS Pager [phone] Office [phone]  06/08/2015, 4:27 PM

## 2015-06-08 NOTE — Clinical Social Work Note (Signed)
PASARR received: [id number]  [facility]2015. Weekend CSW to contact Peggy in admissions at (o) [phone] or (c) [phone]. Patient will be transported via [facility].  Marcelline Deistmily Keaten Mashek, LCSW [phone] Orthopedics: [phone] Surgical: [phone]

## 2015-06-08 NOTE — Progress Notes (Signed)
Physical Therapy Treatment Patient Details Name: Natalie PaciniSuzanne C Whitney MRN: [id number] DOB: 11/08/1945 Today's Date: 06/08/2015    History of Present Illness 70 y.o. female now s/p Rt TKA. PMH: hypertension, diabetes, heart murmur.     PT Comments    Pt. Progressing with mobility during PT session. Able to advance to ambulating 50 feet with rw, cues for even weight bearing and stance time. PT to continue to follow with anticipation of D/C to SNF.   Follow Up Recommendations  SNF;Supervision for mobility/OOB     Equipment Recommendations  None recommended by PT    Recommendations for Other Services       Precautions / Restrictions Precautions Precautions: Knee;Fall Restrictions Weight Bearing Restrictions: Yes RLE Weight Bearing: Weight bearing as tolerated    Mobility  Bed Mobility Overal bed mobility: Needs Assistance Bed Mobility: Supine to Sit     Supine to sit: Min assist;HOB elevated     General bed mobility comments: assist provided to Rt LE, HOB elevated approx. 20 degrees  Transfers Overall transfer level: Needs assistance Equipment used: Rolling walker (2 wheeled) Transfers: Sit to/from Stand Sit to Stand: Min guard         General transfer comment: cues for hand position, encouraging knee flexion .   Ambulation/Gait Ambulation/Gait assistance: Min guard Ambulation Distance (Feet): 50 Feet Assistive device: Rolling walker (2 wheeled)   Gait velocity: decreased   General Gait Details: initially with step-to pattern but progressing to step-through with verbal cues.    Stairs            Wheelchair Mobility    Modified Rankin (Stroke Patients Only)       Balance Overall balance assessment: Needs assistance Sitting-balance support: No upper extremity supported Sitting balance-Leahy Scale: Good     Standing balance support: Bilateral upper extremity supported Standing balance-Leahy Scale: Poor Standing balance comment: using rw                    Cognition Arousal/Alertness: Awake/alert Behavior During Therapy: WFL for tasks assessed/performed Overall Cognitive Status: Within Functional Limits for tasks assessed                      Exercises Total Joint Exercises Ankle Circles/Pumps: AROM;Both;10 reps Quad Sets: Strengthening;Right;10 reps Heel Slides: AAROM;Right;10 reps Hip ABduction/ADduction: Strengthening;Right;10 reps;Other (comment) (min assist) Goniometric ROM: knee flexion approx. 60 degrees    General Comments        Pertinent Vitals/Pain Pain Assessment: 0-10 Pain Score: 6  Pain Location: rt knee Pain Descriptors / Indicators: Aching Pain Intervention(s): Limited activity within patient's tolerance;Monitored during session    Home Living                      Prior Function            PT Goals (current goals can now be found in the care plan section) Acute Rehab PT Goals Patient Stated Goal: be able to do a zumba class. PT Goal Formulation: With patient Time For Goal Achievement: 06/21/15 Potential to Achieve Goals: Good Progress towards PT goals: Progressing toward goals    Frequency  7X/week    PT Plan Current plan remains appropriate    Co-evaluation             End of Session Equipment Utilized During Treatment: Gait belt Activity Tolerance: Patient tolerated treatment well Patient left: in chair;with call bell/phone within reach;with family/visitor present     Time: 4098-11910832-0906  PT Time Calculation (min) (ACUTE ONLY): 34 min  Charges:  $Gait Training: 8-22 mins $Therapeutic Exercise: 8-22 mins                    G Codes:      Christiane Ha, PT, CSCS Pager [phone] Office [phone]  06/08/2015, 9:19 AM

## 2015-06-09 LAB — CBC
HEMATOCRIT: 36 % (ref 36.0–46.0)
Hemoglobin: 11.5 g/dL — ABNORMAL LOW (ref 12.0–15.0)
MCH: 28 pg (ref 26.0–34.0)
MCHC: 31.9 g/dL (ref 30.0–36.0)
MCV: 87.8 fL (ref 78.0–100.0)
Platelets: 204 10*3/uL (ref 150–400)
RBC: 4.1 MIL/uL (ref 3.87–5.11)
RDW: 13.9 % (ref 11.5–15.5)
WBC: 8.6 10*3/uL (ref 4.0–10.5)

## 2015-06-09 LAB — GLUCOSE, CAPILLARY
GLUCOSE-CAPILLARY: 88 mg/dL (ref 65–99)
GLUCOSE-CAPILLARY: 140 mg/dL — AB (ref 65–99)

## 2015-06-09 NOTE — Discharge Summary (Signed)
  Patient's discharge to skilled nursing was delayed for one day. There is no change in patients discharge summary she is alert oriented there were no complications. Follow-up in the office in 2 weeks.

## 2015-06-09 NOTE — Progress Notes (Signed)
[facility] spoke with Thayer Ohmhris

## 2015-06-09 NOTE — Clinical Social Work Note (Signed)
Clinical Social Worker facilitated patient discharge including contacting patient family and facility to confirm patient discharge plans.  Clinical information faxed to facility and family agreeable with plan.  CSW arranged ambulance transport via [facility] to [facility].  RN to call report prior to discharge.  Clinical Social Worker will sign off for now as social work intervention is no longer needed. Please consult us again if new need arises.  Jesse Gianelle Mccaul, LCSW [phone] 

## 2015-06-09 NOTE — Progress Notes (Signed)
Physical Therapy Treatment Patient Details Name: Natalie Whitney MRN: [id number] DOB: 1946/03/07 Today's Date: 06/09/2015    History of Present Illness 70 y.o. female now s/p Rt TKA. PMH: hypertension, diabetes, heart murmur.     PT Comments    Better tolerance with gait today, improving symmetry and stability. Reviewed precautions and exercises. Has 20 steps to enter home and will benefit from continued rehab to ensure independence with this task prior to returning home. Patient will continue to benefit from skilled physical therapy services to further improve independence with functional mobility.   Follow Up Recommendations  SNF;Supervision for mobility/OOB     Equipment Recommendations  None recommended by PT    Recommendations for Other Services       Precautions / Restrictions Precautions Precautions: Knee;Fall Precaution Comments: Reviewed precautions Restrictions Weight Bearing Restrictions: Yes RLE Weight Bearing: Weight bearing as tolerated    Mobility  Bed Mobility               General bed mobility comments: sitting in recliner  Transfers Overall transfer level: Needs assistance Equipment used: Rolling walker (2 wheeled) Transfers: Sit to/from Stand Sit to Stand: Min guard         General transfer comment: Min guard for safety. VC for hand placement. Effortful but performed slowly without physical assist.  Ambulation/Gait Ambulation/Gait assistance: Min guard Ambulation Distance (Feet): 115 Feet Assistive device: Rolling walker (2 wheeled) Gait Pattern/deviations: Step-through pattern;Decreased step length - left;Decreased stance time - right;Decreased stride length Gait velocity: decreased Gait velocity interpretation: Below normal speed for age/gender General Gait Details: Decreased flexion of Rt knee throughout gait cycle. Moderately antalgic. VC for Rt knee extension in stance phase for quad activation. No buckling noted although light  instability if not cued for awareness.    Stairs            Wheelchair Mobility    Modified Rankin (Stroke Patients Only)       Balance                                    Cognition Arousal/Alertness: Awake/alert Behavior During Therapy: WFL for tasks assessed/performed Overall Cognitive Status: Within Functional Limits for tasks assessed                      Exercises Total Joint Exercises Ankle Circles/Pumps: AROM;Both;10 reps;Seated Quad Sets: Strengthening;10 reps;Seated;Both Gluteal Sets: Strengthening;Both;10 reps;Seated Heel Slides: AAROM;Right;10 reps;Seated Straight Leg Raises:  (min assist) Long Arc Quad: Strengthening;Right;10 reps;Seated    General Comments        Pertinent Vitals/Pain Pain Assessment: Faces Faces Pain Scale: Hurts little more Pain Location: Rt knee Pain Descriptors / Indicators: Aching Pain Intervention(s): Monitored during session;Repositioned    Home Living                      Prior Function            PT Goals (current goals can now be found in the care plan section) Acute Rehab PT Goals Patient Stated Goal: be able to do a zumba class. PT Goal Formulation: With patient Time For Goal Achievement: 06/21/15 Potential to Achieve Goals: Good Progress towards PT goals: Progressing toward goals    Frequency  7X/week    PT Plan Current plan remains appropriate    Co-evaluation             End  of Session Equipment Utilized During Treatment: Gait belt Activity Tolerance: Patient tolerated treatment well Patient left: with call bell/phone within reach;with family/visitor present;in chair     Time: [id number] PT Time Calculation (min) (ACUTE ONLY): 16 min  Charges:  $Gait Training: 8-22 mins                    G Codes:      [place], Niasia Lanphear S 06/09/2015, 12:28 PM  Sunday [place], [place] [phone]

## 2015-06-09 NOTE — Clinical Social Work Placement (Signed)
   CLINICAL SOCIAL WORK PLACEMENT  NOTE  Date:  06/09/2015  Patient Details  Name: Natalie Whitney MRN: [id number] Date of Birth: 10/16/1945  Clinical Social Work is seeking post-discharge placement for this patient at the Skilled  Nursing Facility level of care (*CSW will initial, date and re-position this form in  chart as items are completed):  Yes   Patient/family provided with [facility] Clinical Social Work Department's list of facilities offering this level of care within the geographic area requested by the patient (or if unable, by the patient's family).  Yes   Patient/family informed of their freedom to choose among providers that offer the needed level of care, that participate in Medicare, Medicaid or managed care program needed by the patient, have an available bed and are willing to accept the patient.  Yes   Patient/family informed of [facility]'s ownership interest in [facility] and [facility], as well as of the fact that they are under no obligation to receive care at these facilities.  PASRR submitted to EDS on 06/07/15     PASRR number received on       Existing PASRR number confirmed on       FL2 transmitted to all facilities in geographic area requested by pt/family on 06/07/15     FL2 transmitted to all facilities within larger geographic area on       Patient informed that his/her managed care company has contracts with or will negotiate with certain facilities, including the following:        Yes   Patient/family informed of bed offers received.  [facility]     Physician recommends and patient chooses bed at      [facility] on 06/09/15.  Patient to be transferred to facility by [facility]     Patient family notified on 06/09/15 of transfer.  Name of family member notified:  Patient daughter at bedside     PHYSICIAN Please sign FL2     Additional Comment:    Macario GoldsJesse  Oswin Johal, LCSW [phone]

## 2017-04-27 ENCOUNTER — Telehealth (INDEPENDENT_AMBULATORY_CARE_PROVIDER_SITE_OTHER): Payer: Self-pay | Admitting: Orthopedic Surgery

## 2017-04-27 NOTE — Telephone Encounter (Signed)
Patient had right knee surgery back in March of 2017 with Dr. Lajoyce Cornersuda, now every time she bends her knee it pops and she didn't know if she needed to make an appointment or what you would suggest. CB # [phone]

## 2017-04-27 NOTE — Telephone Encounter (Signed)
Please call pt and make appt for eval. Surgery was almost 2 years ago would need visit in office to advise.

## 2017-05-06 ENCOUNTER — Encounter (INDEPENDENT_AMBULATORY_CARE_PROVIDER_SITE_OTHER): Payer: Self-pay | Admitting: Orthopedic Surgery

## 2017-05-06 ENCOUNTER — Ambulatory Visit (INDEPENDENT_AMBULATORY_CARE_PROVIDER_SITE_OTHER): Payer: Medicare Other | Admitting: Orthopedic Surgery

## 2017-05-06 ENCOUNTER — Ambulatory Visit (INDEPENDENT_AMBULATORY_CARE_PROVIDER_SITE_OTHER): Payer: Medicare Other

## 2017-05-06 VITALS — Ht 64.0 in | Wt 213.0 lb

## 2017-05-06 DIAGNOSIS — G8929 Other chronic pain: Secondary | ICD-10-CM | POA: Diagnosis not present

## 2017-05-06 DIAGNOSIS — M25561 Pain in right knee: Secondary | ICD-10-CM

## 2017-05-06 DIAGNOSIS — Z96651 Presence of right artificial knee joint: Secondary | ICD-10-CM

## 2017-05-06 NOTE — Progress Notes (Signed)
Office Visit Note   Patient: Natalie Whitney           Date of Birth: 09/08/1945           MRN: [id number] Visit Date: 05/06/2017              Requested by: Alinda DeemJannach, Stephen, MD [address] [place], [place] [id number] PCP: Alinda DeemJannach, Stephen, MD  Chief Complaint  Patient presents with  . Right Knee - Pain    06/05/16 right total knee replacement       HPI: Patient presents approximately a year out from a right total knee arthroplasty she states that she was taking care of a family member was unable to exercise for about 2 months and has been having some popping in her knee and increased stiffness.  Assessment & Plan: Visit Diagnoses:  1. Chronic pain of right knee   2. Status post total right knee replacement     Plan: Patient has developed quad atrophy and hamstring weakness.  Will plan for her to resume her therapy.  Examination patient has full range of motion of the right knee  Follow-Up Instructions: Return if symptoms worsen or fail to improve.   Ortho Exam  Patient is alert, oriented, no adenopathy, well-dressed, normal affect, normal respiratory effort. Varus and valgus stress and anterior drawer are stable.  The patella tracks midline there is no subluxation.  There is no redness no cellulitis no effusion no signs of infection.  She has mild crepitation in the patellofemoral joint with range of motion.  Imaging: Xr Knee 1-2 Views Right  Result Date: 05/06/2017 2 view radiographs of the right knee shows a stable total knee arthroplasty no lucency around the hardware no varus or valgus malalignment.  The joint space is congruent.  No images are attached to the encounter.  Labs: Lab Results  Component Value Date   HGBA1C 6.3 (H) 06/06/2015    @LABSALLVALUES (HGBA1)@  Body mass index is 36.56 kg/m.  Orders:  Orders Placed This Encounter  Procedures  . XR Knee 1-2 Views Right   No orders of the defined types were placed in this encounter.    Procedures: No  procedures performed  Clinical Data: No additional findings.  ROS:  All other systems negative, except as noted in the HPI. Review of Systems  Objective: Vital Signs: Ht 5\' 4"  (1.626 m)   Wt 213 lb (96.6 kg)   BMI 36.56 kg/m   Specialty Comments:  No specialty comments available.  PMFS History: Patient Active Problem List   Diagnosis Date Noted  . 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 Arthritis   . Diabetes mellitus without complication (HCC)   . Family history of adverse reaction to anesthesia    daughter has nausea  . Heart murmur   . History of hiatal hernia   . Hypertension   . Sleep apnea    stopped cpap    History reviewed. No pertinent family history.  Past Surgical History:  Procedure Laterality Date  . APPENDECTOMY    . COLONOSCOPY    . DILATION AND CURETTAGE OF UTERUS    . 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   Procedure: TOTAL KNEE ARTHROPLASTY;  Surgeon: Nadara MustardMarcus Tomisha Reppucci V, MD;  Location: [facility] OR;  Service: Orthopedics;  Laterality: Right;   Social History   Occupational History  . Not on file  Tobacco Use  . Smoking status: Never Smoker  . Smokeless tobacco: Never Used  Substance and Sexual Activity  . Alcohol use: No  . Drug use: No  . Sexual activity: Not on file

## 2018-03-10 HISTORY — PX: EYE SURGERY: SHX253

## 2020-09-25 ENCOUNTER — Ambulatory Visit (INDEPENDENT_AMBULATORY_CARE_PROVIDER_SITE_OTHER): Payer: Medicare Other

## 2020-09-25 ENCOUNTER — Encounter: Payer: Self-pay | Admitting: Orthopedic Surgery

## 2020-09-25 ENCOUNTER — Ambulatory Visit (INDEPENDENT_AMBULATORY_CARE_PROVIDER_SITE_OTHER): Payer: Medicare Other | Admitting: Orthopedic Surgery

## 2020-09-25 VITALS — Ht 63.0 in | Wt 170.0 lb

## 2020-09-25 DIAGNOSIS — M545 Low back pain, unspecified: Secondary | ICD-10-CM | POA: Diagnosis not present

## 2020-09-25 DIAGNOSIS — Z96651 Presence of right artificial knee joint: Secondary | ICD-10-CM

## 2020-09-25 DIAGNOSIS — M79604 Pain in right leg: Secondary | ICD-10-CM

## 2020-09-25 MED ORDER — PREDNISONE 10 MG PO TABS: 10.0000 mg | ORAL_TABLET | Freq: Every day | ORAL | 0 refills | Status: DC

## 2020-09-26 ENCOUNTER — Encounter: Payer: Self-pay | Admitting: Orthopedic Surgery

## 2020-09-26 NOTE — Progress Notes (Signed)
Office Visit Note   Patient: Natalie Whitney           Date of Birth: 12-29-1945           MRN: [id number] Visit Date: 09/25/2020              Requested by: Alinda Deem, MD [address] [place],  [place] [address] PCP: Alinda Deem, MD  Chief Complaint  Patient presents with   Right Knee - Pain      HPI: Patient is a 75 year old woman who presents complaining of cramping pain in the lateral aspect of the right thigh and right calf.  She states she has radicular pain that radiates from the buttocks down to the lateral thigh.  She states she has had no problems with her total knee arthroplasty.  She states that her limping has gotten worse over the past month.  She is status post right total knee arthroplasty in 2017 patient complains of lower back pain as well.  She has taken Aleve without problem she denies any groin pain she states the pain is worse at night.  Patient states that her primary care physician told her she was dehydrated.  Assessment & Plan: Visit Diagnoses:  1. Pain in right leg     Plan: Recommended coconut water to help with the electrolyte replenishment.  A low-dose prednisone was sent to her pharmacy to take 10 mg with breakfast and she will wean off this as she feels better.  Follow-up in 4 weeks.  If patient is not showing any improvement we may need to order an MRI scan.  Follow-Up Instructions: Return in about 4 weeks (around 10/23/2020).   Ortho Exam  Patient is alert, oriented, no adenopathy, well-dressed, normal affect, normal respiratory effort. Examination patient has a negative straight leg raise on the right with no focal motor weakness no pain with range of motion of the hip knee or ankle.  Imaging: No results found. No images are attached to the encounter.  Labs: Lab Results  Component Value Date   HGBA1C 6.3 (H) 06/06/2015     Lab Results  Component Value Date   ALBUMIN 4.1 05/28/2015    No results found for: MG No results  found for: VD25OH  No results found for: PREALBUMIN CBC EXTENDED Latest Ref Rng & Units 06/09/2015 06/08/2015 06/07/2015  WBC 4.0 - 10.5 K/uL 8.6 9.9 8.7  RBC 3.87 - 5.11 MIL/uL 4.10 4.32 4.59  HGB 12.0 - 15.0 g/dL 11.5(L) 12.5 12.8  HCT 36.0 - 46.0 % 36.0 37.8 40.0  PLT 150 - 400 K/uL 204 199 191     Body mass index is 30.11 kg/m.  Orders:  Orders Placed This Encounter  Procedures   XR Lumbar Spine 2-3 Views   XR Knee 1-2 Views Right   Meds ordered this encounter  Medications   predniSONE (DELTASONE) 10 MG tablet    Sig: Take 1 tablet (10 mg total) by mouth daily with breakfast.    Dispense:  30 tablet    Refill:  0     Procedures: No procedures performed  Clinical Data: No additional findings.  ROS:  All other systems negative, except as noted in the HPI. Review of Systems  Objective: Vital Signs: Ht 5\' 3"  (1.6 m)   Wt 170 lb (77.1 kg)   BMI 30.11 kg/m   Specialty Comments:  No specialty comments available.  PMFS History: Patient Active Problem List   Diagnosis Date Noted   Total knee replacement  status 06/06/2015   Past Medical History:  Diagnosis Date   Arthritis    Diabetes mellitus without complication (HCC)    Family history of adverse reaction to anesthesia    daughter has nausea   Heart murmur    History of hiatal hernia    Hypertension    Sleep apnea    stopped cpap    History reviewed. No pertinent family history.  Past Surgical History:  Procedure Laterality Date   APPENDECTOMY     COLONOSCOPY     DILATION AND CURETTAGE OF UTERUS     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   Procedure: TOTAL KNEE ARTHROPLASTY;  Surgeon: Nadara Mustard, MD;  Location: [facility] OR;  Service: Orthopedics;  Laterality: Right;   Social History   Occupational History   Not on file  Tobacco Use   Smoking status: Never   Smokeless tobacco: Never  Substance and Sexual Activity   Alcohol use: No   Drug use: No   Sexual activity: Not on file

## 2020-10-22 ENCOUNTER — Other Ambulatory Visit: Payer: Self-pay | Admitting: Orthopedic Surgery

## 2020-11-27 ENCOUNTER — Ambulatory Visit: Payer: Medicare Other | Admitting: Orthopedic Surgery

## 2020-11-27 ENCOUNTER — Ambulatory Visit (INDEPENDENT_AMBULATORY_CARE_PROVIDER_SITE_OTHER): Payer: Medicare Other | Admitting: Orthopedic Surgery

## 2020-11-27 DIAGNOSIS — Z96651 Presence of right artificial knee joint: Secondary | ICD-10-CM | POA: Diagnosis not present

## 2020-11-27 DIAGNOSIS — M545 Low back pain, unspecified: Secondary | ICD-10-CM

## 2020-11-27 DIAGNOSIS — M79604 Pain in right leg: Secondary | ICD-10-CM

## 2020-11-28 ENCOUNTER — Encounter: Payer: Self-pay | Admitting: Orthopedic Surgery

## 2020-11-28 NOTE — Progress Notes (Signed)
Office Visit Note   Patient: Natalie Whitney           Date of Birth: 06/19/1945           MRN: [id number] Visit Date: 11/27/2020              Requested by: Alinda Deem, MD [address] [place],  [place] [address] PCP: Alinda Deem, MD  No chief complaint on file.     HPI: Patient is a 75 year old woman who presents in follow-up for right knee pain and right radicular symptoms.  Patient states that she went to a chiropractor yesterday and had a massage.  Patient states that she took 5 days of the prednisone 10 mg with breakfast and had significant improvement in her radicular symptoms.  Patient states that she has been having the radicular pain for 2 months from the thigh to the calf laterally on the right.  Previous radiographs showed a well aligned right total knee arthroplasty.  Patient states that she is now having difficulty going up stairs with weakness in her right thigh.  Assessment & Plan: Visit Diagnoses:  1. Pain in right leg   2. Low back pain radiating to right leg   3. Status post total right knee replacement     Plan: Discussed that her weakness on the right side is most likely due to the impingement on the sciatic nerve recommend that she resume taking prednisone 10 mg with breakfast she will wean off if the symptoms resolve.  At follow-up will evaluate for possible MRI scan for consideration of epidural steroids if there is not sufficient relief with the oral prednisone.  Follow-Up Instructions: Return in about 3 weeks (around 12/18/2020).   Ortho Exam  Patient is alert, oriented, no adenopathy, well-dressed, normal affect, normal respiratory effort. Examination patient has difficulty getting from a sitting to a standing position.  Hip flexor strength appears grossly symmetric she has a negative straight leg raise on the right motor strength in the right foot is 5/5.  Imaging: No results found. No images are attached to the encounter.  Labs: Lab Results   Component Value Date   HGBA1C 6.3 (H) 06/06/2015     Lab Results  Component Value Date   ALBUMIN 4.1 05/28/2015    No results found for: MG No results found for: VD25OH  No results found for: PREALBUMIN CBC EXTENDED Latest Ref Rng & Units 06/09/2015 06/08/2015 06/07/2015  WBC 4.0 - 10.5 K/uL 8.6 9.9 8.7  RBC 3.87 - 5.11 MIL/uL 4.10 4.32 4.59  HGB 12.0 - 15.0 g/dL 11.5(L) 12.5 12.8  HCT 36.0 - 46.0 % 36.0 37.8 40.0  PLT 150 - 400 K/uL 204 199 191     There is no height or weight on file to calculate BMI.  Orders:  No orders of the defined types were placed in this encounter.  No orders of the defined types were placed in this encounter.    Procedures: No procedures performed  Clinical Data: No additional findings.  ROS:  All other systems negative, except as noted in the HPI. Review of Systems  Objective: Vital Signs: There were no vitals taken for this visit.  Specialty Comments:  No specialty comments available.  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 No pertinent family history.  Past Surgical History:  Procedure Laterality Date   APPENDECTOMY     COLONOSCOPY     DILATION AND CURETTAGE OF UTERUS     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   Procedure: TOTAL KNEE ARTHROPLASTY;  Surgeon: Nadara Mustard, MD;  Location: [facility] OR;  Service: Orthopedics;  Laterality: Right;   Social History   Occupational History   Not on file  Tobacco Use   Smoking status: Never   Smokeless tobacco: Never  Substance and Sexual Activity   Alcohol use: No   Drug use: No   Sexual activity: Not on file

## 2020-12-17 ENCOUNTER — Ambulatory Visit: Payer: Self-pay

## 2020-12-17 ENCOUNTER — Ambulatory Visit (INDEPENDENT_AMBULATORY_CARE_PROVIDER_SITE_OTHER): Payer: Medicare Other | Admitting: Orthopedic Surgery

## 2020-12-17 DIAGNOSIS — M79644 Pain in right finger(s): Secondary | ICD-10-CM | POA: Diagnosis not present

## 2020-12-18 ENCOUNTER — Encounter: Payer: Self-pay | Admitting: Orthopedic Surgery

## 2020-12-18 ENCOUNTER — Ambulatory Visit (INDEPENDENT_AMBULATORY_CARE_PROVIDER_SITE_OTHER): Payer: Medicare Other | Admitting: Orthopedic Surgery

## 2020-12-18 ENCOUNTER — Ambulatory Visit: Payer: Self-pay

## 2020-12-18 ENCOUNTER — Other Ambulatory Visit: Payer: Self-pay

## 2020-12-18 ENCOUNTER — Ambulatory Visit: Payer: MEDICARE | Admitting: Orthopedic Surgery

## 2020-12-18 VITALS — BP 117/76 | HR 71 | Ht 63.0 in | Wt 170.0 lb

## 2020-12-18 DIAGNOSIS — M20021 Boutonniere deformity of right finger(s): Secondary | ICD-10-CM

## 2020-12-18 DIAGNOSIS — M79644 Pain in right finger(s): Secondary | ICD-10-CM

## 2020-12-18 NOTE — Progress Notes (Signed)
Office Visit Note   Patient: Natalie Whitney           Date of Birth: [birth year]           MRN: [id number] Visit Date: 12/17/2020              Requested by: Alinda Deem, MD [address] [place],  [place] [address] PCP: Alinda Deem, MD  Chief Complaint  Patient presents with   Right Hand - Fracture    5th finger fracture      HPI: Patient is a 75 year old woman who states she fell off a step injuring her right little finger with ecchymosis and bruising.  The finger is flexed at 90 degrees she states she also sustained blunt trauma to her left knee.  She states that her back is been doing much better after taking the prednisone 10 mg with breakfast.  She states that she is weaned off the prednisone and is back to regular activities.  Assessment & Plan: Visit Diagnoses:  1. Finger pain, right     Plan: Patient has a injury to the extensor mechanism PIP joint right little finger.  We will have this evaluated for surgical intervention with Dr. Frazier Butt.  The aluminum splint was reapplied.  Follow-Up Instructions: Return if symptoms worsen or fail to improve, for Plan to have patient evaluated by Dr. Frazier Butt.   Ortho Exam  Patient is alert, oriented, no adenopathy, well-dressed, normal affect, normal respiratory effort. Examination of the left knee she has full range of motion there is no crepitation there is no tenderness to palpation of the medial lateral joint line she does have some tenderness to palpation over the hamstring tendons there is no effusion.  She has some bruises on the right knee and left shoulder.  Examination of the right little finger there is ecchymosis bruising and swelling.  Patient cannot actively extend the finger passively her finger will extend at the PIP joint.  Imaging: XR Finger Little Right  Result Date: 12/18/2020 2 view radiographs of the right little finger shows a subluxed PIP joint of the little finger.    No images are attached to the  encounter.  Labs: Lab Results  Component Value Date   HGBA1C 6.3 (H) 06/06/2015     Lab Results  Component Value Date   ALBUMIN 4.1 05/28/2015    No results found for: MG No results found for: VD25OH  No results found for: PREALBUMIN CBC EXTENDED Latest Ref Rng & Units 06/09/2015 06/08/2015 06/07/2015  WBC 4.0 - 10.5 K/uL 8.6 9.9 8.7  RBC 3.87 - 5.11 MIL/uL 4.10 4.32 4.59  HGB 12.0 - 15.0 g/dL 11.5(L) 12.5 12.8  HCT 36.0 - 46.0 % 36.0 37.8 40.0  PLT 150 - 400 K/uL 204 199 191     There is no height or weight on file to calculate BMI.  Orders:  Orders Placed This Encounter  Procedures   XR Finger Little Right   No orders of the defined types were placed in this encounter.    Procedures: No procedures performed  Clinical Data: No additional findings.  ROS:  All other systems negative, except as noted in the HPI. Review of Systems  Objective: Vital Signs: There were no vitals taken for this visit.  Specialty Comments:  No specialty comments available.  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 No pertinent family history.  Past Surgical History:  Procedure Laterality Date   APPENDECTOMY     COLONOSCOPY     DILATION AND CURETTAGE OF UTERUS     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   Procedure: TOTAL KNEE ARTHROPLASTY;  Surgeon: Nadara Mustard, MD;  Location: [facility] OR;  Service: Orthopedics;  Laterality: Right;   Social History   Occupational History   Not on file  Tobacco Use   Smoking status: Never   Smokeless tobacco: Never  Substance and Sexual Activity   Alcohol use: No   Drug use: No    Sexual activity: Not on file

## 2020-12-19 ENCOUNTER — Telehealth: Payer: Self-pay | Admitting: Orthopedic Surgery

## 2020-12-19 NOTE — Telephone Encounter (Signed)
Patient called. She would like Dr. Frazier Butt to call her. [phone]. Would like to know what was decided as far as having surgery.

## 2020-12-20 DIAGNOSIS — M20021 Boutonniere deformity of right finger(s): Secondary | ICD-10-CM | POA: Insufficient documentation

## 2020-12-20 NOTE — Progress Notes (Signed)
Office Visit Note   Patient: Natalie Whitney           Date of Birth: 01/30/1946           MRN: [id number] Visit Date: 12/18/2020              Requested by: Alinda Deem, MD [address] [place],  [place] [address] PCP: Alinda Deem, MD   Assessment & Plan: Visit Diagnoses:  1. Finger pain, right   2. Central slip extensor tendon injury (boutonniere), right     Plan: We had an extensive discussion regarding treatment options for her central slip avulsion injury including PIP extension splinting and surgical fixation.  We discussed the pros and cons of each treatment option.  At this point, we will plan to treat this injury nonoperatively with full-time extension at the PIP joint for at least 6 weeks.  We will keep the MP and DIP joints free to allow maintenance of range of motion.  Flexion extension of the DIP will also help to keep the lateral bands in dorsal position prevent him from subluxing volarly creating the boutonniere posture.  I will put in the referral for hand third had a splint made.  Follow-Up Instructions: No follow-ups on file.   Orders:  Orders Placed This Encounter  Procedures   XR C-ARM NO REPORT   Ambulatory referral to Occupational Therapy   No orders of the defined types were placed in this encounter.     Procedures: No procedures performed   Clinical Data: No additional findings.   Subjective: Chief Complaint  Patient presents with   Right Little Finger - Fracture, Injury    Fell landing on knees, her finger hit something and it bent into a 90 degree angle, went to UC in [place] and was told it was dislocated and fx, they tried to put it back in placed. Saw Dr. Lajoyce Corners yesterday and they did xrays and was told he did not think the tendons were where they are supposed to be. Has not taken pain meds since yesterday    This is a 75 year old right-hand-dominant female who presents with a right small finger injury.  Her injury occurred on 10/9 at  which time she was out to lunch and fell.  She thinks she may have reached up with her right hand to catch her self.  She notes that after the injury she had a flexion deformity at the small finger PIP joint was made unable to extend it.  She was seen in outside ER where she was told that her finger was dislocated.  Some sort of reduction was performed.  She was placed in an aluminum splint.  Today she describes significant swelling over the small finger.  Her finger is resting in a flexed position at the PIP joint.  It is passively extendable.  She denies any pain elsewhere in her hand.  Injury   Review of Systems  Constitutional: Negative.   Respiratory: Negative.    Cardiovascular: Negative.   Musculoskeletal:  Positive for joint swelling.  Skin:  Positive for color change.    Objective: Vital Signs: BP 117/76 (BP Location: Left Arm, Patient Position: Sitting)   Pulse 71   Ht 5\' 3"  (1.6 m)   Wt 170 lb (77.1 kg)   BMI 30.11 kg/m   Physical Exam Constitutional:      Appearance: Normal appearance.  Cardiovascular:     Rate and Rhythm: Normal rate.  Pulmonary:     Effort: Pulmonary  effort is normal.  Skin:    General: Skin is warm and dry.     Capillary Refill: Capillary refill takes less than 2 seconds.  Neurological:     Mental Status: She is alert.    Right Hand Exam   Tenderness  Right hand tenderness location: TTP over PIP joint with moderate swelling and significant ecchymosis.  Other  Sensation: normal Pulse: present  Comments:  Small finger held in flexion at PIP joint.  PIP joint can be fully passively extended with minimal discomfort.  + Elson test.  No pain at the MP or DIP joints.      Specialty Comments:  No specialty comments available.  Imaging: 3 views of the right hand taken at the outside hospital are reviewed interpreted by me today.  They demonstrate flexion at the PIP joint.  There are 2 appears to be a avulsion fragment coming from the dorsal  base of the middle phalanx.  There is a another fragment more distally over the mid aspect of the middle phalanx which seems old given that its a little bit sclerotic with dense borders.  Based on these views the finger looks reduced.  C arm imaging performed in the office also reviewed and interpreted by me today.  Demonstrates concentric reduction of the small finger PIP joint.  Again there is a small fragment proximal to the dorsal base of the middle phalanx which may be a small central slip avulsion fragment.  The AP view was not saved but also demonstrates concentric reduction of the small finger. 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  Past Surgical History:  Procedure Laterality Date   APPENDECTOMY     COLONOSCOPY     DILATION AND CURETTAGE OF UTERUS     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   Procedure: TOTAL KNEE ARTHROPLASTY;  Surgeon: Nadara Mustard, MD;  Location: [facility] OR;  Service: Orthopedics;  Laterality: Right;   Social History   Occupational History   Not on file  Tobacco Use   Smoking status: Never   Smokeless tobacco: Never  Substance and Sexual Activity   Alcohol use: No   Drug use: No   Sexual activity: Not on file

## 2020-12-25 ENCOUNTER — Encounter: Payer: MEDICARE | Admitting: Occupational Therapy

## 2020-12-27 ENCOUNTER — Other Ambulatory Visit: Payer: Self-pay

## 2020-12-27 ENCOUNTER — Encounter: Payer: Self-pay | Admitting: Occupational Therapy

## 2020-12-27 ENCOUNTER — Ambulatory Visit (INDEPENDENT_AMBULATORY_CARE_PROVIDER_SITE_OTHER): Payer: Medicare Other | Admitting: Occupational Therapy

## 2020-12-27 DIAGNOSIS — R278 Other lack of coordination: Secondary | ICD-10-CM | POA: Diagnosis not present

## 2020-12-27 DIAGNOSIS — M6281 Muscle weakness (generalized): Secondary | ICD-10-CM

## 2020-12-27 DIAGNOSIS — M25641 Stiffness of right hand, not elsewhere classified: Secondary | ICD-10-CM

## 2020-12-27 DIAGNOSIS — R600 Localized edema: Secondary | ICD-10-CM

## 2020-12-27 DIAGNOSIS — M79644 Pain in right finger(s): Secondary | ICD-10-CM

## 2020-12-27 NOTE — Therapy (Signed)
[facility] [address] [place], [place], [address] Phone: [phone]   Fax:  [phone]  Occupational Therapy Evaluation  Patient Details  Name: Natalie Whitney MRN: [id number] Date of Birth: Apr 16, 1945 Referring Provider (OT): Dr Frazier Butt   Encounter Date: 12/27/2020   OT End of Session - 12/27/20 1716     Visit Number 1    Number of Visits 4    Date for OT Re-Evaluation 01/24/21   Every 10th visit   Authorization Type Medicare A & B and [employer] Medicare suppliment    Authorization - Number of Visits 1    OT Start Time 0158    OT Stop Time 0256    OT Time Calculation (min) 58 min    Activity Tolerance Patient tolerated treatment well    Behavior During Therapy [facility] for tasks assessed/performed             Past Medical History:  Diagnosis Date   Arthritis    Diabetes mellitus without complication (HCC)    Family history of adverse reaction to anesthesia    daughter has nausea   Heart murmur    History of hiatal hernia    Hypertension    Sleep apnea    stopped cpap    Past Surgical History:  Procedure Laterality Date   APPENDECTOMY     COLONOSCOPY     DILATION AND CURETTAGE OF UTERUS     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   Procedure: TOTAL KNEE ARTHROPLASTY;  Surgeon: Nadara Mustard, MD;  Location: [facility] OR;  Service: Orthopedics;  Laterality: Right;    There were no vitals filed for this visit.   Subjective Assessment - 12/27/20 1405     Subjective  Pt sustained a boutonniere deformity 12/16/20, she presents today per Dr Frazier Butt for protective splinting.    Patient is accompanied by: Family member   Pt sister   Patient Stated Goals Get hand use again    Currently in Pain? No/denies    Multiple Pain Sites No               [facility] OT Assessment - 12/27/20 0001       Assessment   Medical Diagnosis R small finger Boutonniere Injust/Central Slip extensor tendon injury after a fall    Referring Provider (OT)  Dr Frazier Butt    Onset Date/Surgical Date 12/16/20    Hand Dominance Right    Next MD Visit Unknown      Balance Screen   Has the patient fallen in the past 6 months Yes    How many times? 1    Has the patient had a decrease in activity level because of a fear of falling?  No    Is the patient reluctant to leave their home because of a fear of falling?  No      Home  Environment   Family/patient expects to be discharged to: Private residence    Lives With Alone   Pt lies alone and family can assist PRN     Prior Function   Level of Independence Independent with basic ADLs;Independent    Vocation Retired    Leisure Copy Go, exercise class, Bible study      ADL   ADL comments Wearing plastic bag for bathing, family assist PRN, lives alone      Mobility   Mobility Status Independent      Written Expression   Dominant Hand Right      Vision -  History   Baseline Vision Wears glasses all the time      Cognition   Overall Cognitive Status Within Functional Limits for tasks assessed      Observation/Other Assessments   Observations Pt presents to clinic in a prefab wrap and fincger extension splint that was applied at her last MD visit.      Sensation   Light Touch Appears Intact      Coordination   Gross Motor Movements are Fluid and Coordinated Yes    Fine Motor Movements are Fluid and Coordinated No   NT     Edema   Edema Moderate edema noted R small PIP/finger      ROM / Strength   AROM / PROM / Strength --   NT secondary to central slip injury     Hand Function   Comment NT secondary to central slip injury R small finger/hand                      OT Treatments/Exercises (OP) - 12/27/20 0001       ADLs   ADL Comments Pt was educated in splining use, care and precautions. She will wear the splint at all times and wrap her hand for ADL's where it may get wet. Pt is free for A/ROM to [facility] and DIP of right small finger as per Dr Frazier Butt orders.    Pt was educated to call should she need any adjustments to her splint over the next 4-6 weeks.     Splinting   Splinting Pt was fitted with a custom facticated volar extension splint to her right small finger following a central slip/boutonniere deformity on 12/16/20 when she fell while at a [employer]. Her right small finger PIPJ is in full extension with her MCP and DIP free for A/ROM as per Dr Georgia Duff orders. She ans her sister were educated in splinting use, care and precautions. She verbalized understanding in the clinic today. Her splint was initially held on with a velcro strap but pt did not feel as though that would stay on so it was then wrapped in coban instead for increased support and fit per pt report today. Pt will call should she need any adjustments in the next 4-6 weeks. She will f/u with Dr Frazier Butt n 4 more weeks and wear hte splint until then.            WEARING SCHEDULE:  Wear splint at ALL times. Follow up with Dr Frazier Butt as instructed.   PURPOSE:  To prevent movement and for protection until injury can heal   CARE OF SPLINT:  Keep splint away from heat sources including: stove, radiator or furnace, or a car in sunlight. The splint can melt and will no longer fit you properly   Keep away from pets and children   Clean the splint with rubbing alcohol as needed.  * During this time, make sure you also clean your hand/arm as instructed by your therapist and/or perform dressing changes as needed. Then dry hand/arm completely before replacing splint. (When cleaning hand/arm, keep it immobilized in same position until splint is replaced)   PRECAUTIONS/POTENTIAL PROBLEMS: *If you notice or experience increased pain, swelling, numbness, or a lingering reddened area from the splint: Contact your therapist immediately by calling [phone]. You must wear the splint for protection, but we will get you scheduled for adjustments as quickly as possible.  (If only straps or  hooks need to be replaced and NO adjustments  to the splint need to be made, just call the office ahead and let them know you are coming in)   If you have any medical concerns or signs of infection, please call your doctor immediately        OT Education - 12/27/20 1707     Education Details Splinting use, care and precautions R small finger. No functional use of right small PIP, wear splint at all times, ADL's.    Person(s) Educated Patient;Other (comment)   Sister   Methods Explanation;Demonstration;Handout    Comprehension Verbalized understanding;Need further instruction              OT Short Term Goals - 12/27/20 1726       OT SHORT TERM GOAL #1   Title STG's = LTG's Pt wil lbe Mod I splinting use, care and precautions R small finger    Time 4    Period Weeks    Status New    Target Date 01/24/21      OT SHORT TERM GOAL #2   Title Pt will be Mod I edema contorl techniques R hand/finger    Time 4    Period Weeks    Status New    Target Date 01/24/21               OT Long Term Goals - 12/27/20 1727       OT LONG TERM GOAL #1   Title STG's = LTG's                   Plan - 12/27/20 1718     Clinical Impression Statement Pt is a plesant 75 y/o right hand dominant female whom lives alone. She sustained a central slip injury on 12/16/20 and presents today per Dr Frazier Butt for protective splinting. Pt was fitted with a custom volar right small PIP extension splint and as per Br Benfield's orders, her MCP and DIP were left free for ROM. Pt PMH includes: arthritis, DM, heart murmur, HTN, sleep apnea and a TKA. Please refer ot her chart for full PMH. THis is a splinting only eval. Pt should benefit from out-pt OT for splint check and adjustment over 1-3 more visits over next 4 weeks. She will f/u with Dr Frazier Butt for progression of her program in 4 weeks.    OT Occupational Profile and History Problem Focused Assessment - Including review of records relating  to presenting problem    Occupational performance deficits (Please refer to evaluation for details): ADL's    Body Structure / Function / Physical Skills UE functional use;ADL;Pain;Dexterity;Edema;ROM;Flexibility;Coordination;FMC;Decreased knowledge of precautions    Rehab Potential Fair   Splint eval only   Clinical Decision Making Limited treatment options, no task modification necessary    Comorbidities Affecting Occupational Performance: None    Modification or Assistance to Complete Evaluation  No modification of tasks or assist necessary to complete eval    OT Frequency Other (comment)   Splint eval only w/ up to 3 additional visits for splint adjustments   OT Duration Other (comment)   See above   OT Treatment/Interventions Splinting    Plan Splint eval only. Pt may f/u 1-3 additional visits for splinting adjustments PRN over next 4 weeks. Pt to f/u with Dr Frazier Butt in 4 weeks for progression.    Consulted and Agree with Plan of Care Patient;Other (Comment)   pt sister            Patient will benefit from skilled therapeutic intervention  in order to improve the following deficits and impairments:   Body Structure / Function / Physical Skills: UE functional use, ADL, Pain, Dexterity, Edema, ROM, Flexibility, Coordination, FMC, Decreased knowledge of precautions       Visit Diagnosis: Pain in right finger(s) - Plan: Ot plan of care cert/re-cert  Other lack of coordination - Plan: Ot plan of care cert/re-cert  Stiffness of right hand, not elsewhere classified - Plan: Ot plan of care cert/re-cert  Localized edema - Plan: Ot plan of care cert/re-cert  Muscle weakness (generalized) - Plan: Ot plan of care cert/re-cert    Problem List Patient Active Problem List   Diagnosis Date Noted   Central slip extensor tendon injury (boutonniere), right 12/20/2020   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 [place], [place], [address] Phone: [phone]   Fax:  [phone]  Name: Natalie Whitney MRN: [id number] Date of Birth: 11-03-1945

## 2020-12-27 NOTE — Patient Instructions (Signed)
WEARING SCHEDULE:  Wear splint at ALL times. Follow up with Dr Frazier Butt as instructed.  PURPOSE:  To prevent movement and for protection until injury can heal  CARE OF SPLINT:  Keep splint away from heat sources including: stove, radiator or furnace, or a car in sunlight. The splint can melt and will no longer fit you properly  Keep away from pets and children  Clean the splint with rubbing alcohol as needed.  * During this time, make sure you also clean your hand/arm as instructed by your therapist and/or perform dressing changes as needed. Then dry hand/arm completely before replacing splint. (When cleaning hand/arm, keep it immobilized in same position until splint is replaced)  PRECAUTIONS/POTENTIAL PROBLEMS: *If you notice or experience increased pain, swelling, numbness, or a lingering reddened area from the splint: Contact your therapist immediately by calling [phone]. You must wear the splint for protection, but we will get you scheduled for adjustments as quickly as possible.  (If only straps or hooks need to be replaced and NO adjustments to the splint need to be made, just call the office ahead and let them know you are coming in)  If you have any medical concerns or signs of infection, please call your doctor immediately

## 2021-01-09 ENCOUNTER — Telehealth: Payer: Self-pay | Admitting: Orthopedic Surgery

## 2021-01-09 NOTE — Telephone Encounter (Signed)
Patient called advised the tip of her pinkly finger on her right hand still have a lot of discoloration. Patient said she can not move the tip of her finger.Patient wanted to know if there is any thing else she should be doing concerning her finger.  The number to contact patient is [phone] or [phone]

## 2021-01-24 ENCOUNTER — Ambulatory Visit (INDEPENDENT_AMBULATORY_CARE_PROVIDER_SITE_OTHER): Payer: Medicare Other | Admitting: Orthopedic Surgery

## 2021-01-24 ENCOUNTER — Ambulatory Visit (INDEPENDENT_AMBULATORY_CARE_PROVIDER_SITE_OTHER): Payer: Medicare Other | Admitting: Occupational Therapy

## 2021-01-24 ENCOUNTER — Encounter: Payer: Self-pay | Admitting: Orthopedic Surgery

## 2021-01-24 ENCOUNTER — Ambulatory Visit (INDEPENDENT_AMBULATORY_CARE_PROVIDER_SITE_OTHER): Payer: Medicare Other

## 2021-01-24 ENCOUNTER — Other Ambulatory Visit: Payer: Self-pay

## 2021-01-24 ENCOUNTER — Encounter: Payer: Self-pay | Admitting: Occupational Therapy

## 2021-01-24 VITALS — BP 110/73 | HR 60 | Ht 63.0 in | Wt 170.0 lb

## 2021-01-24 DIAGNOSIS — R278 Other lack of coordination: Secondary | ICD-10-CM | POA: Diagnosis not present

## 2021-01-24 DIAGNOSIS — M25641 Stiffness of right hand, not elsewhere classified: Secondary | ICD-10-CM

## 2021-01-24 DIAGNOSIS — M79644 Pain in right finger(s): Secondary | ICD-10-CM

## 2021-01-24 DIAGNOSIS — M6281 Muscle weakness (generalized): Secondary | ICD-10-CM

## 2021-01-24 DIAGNOSIS — M20021 Boutonniere deformity of right finger(s): Secondary | ICD-10-CM | POA: Diagnosis not present

## 2021-01-24 DIAGNOSIS — R600 Localized edema: Secondary | ICD-10-CM | POA: Diagnosis not present

## 2021-01-24 NOTE — Progress Notes (Signed)
Office Visit Note   Patient: Natalie Whitney           Date of Birth: 06-Jul-1945           MRN: [id number] Visit Date: 01/24/2021              Requested by: Alinda Deem, MD [address] [place],  [place] [address] PCP: Alinda Deem, MD   Assessment & Plan: Visit Diagnoses:  1. Central slip extensor tendon injury (boutonniere), right     Plan: Patient has been in the volar PIP extension splint for the last 6 weeks.  During that time, she has developed an extensor lag at the DIP joint.  She has minimal pain at the PIP joint.  Her swelling is much improved.  She likely has stretched out her terminal tendon secondary to having the DIP joint free.  We will plan on immobilizing her like a mallet finger in a thermoplast splint.  She will keep an eye on the dorsal aspect of the PIP joint where she has some skin irritation.  I can see her back in another month.   Follow-Up Instructions: No follow-ups on file.   Orders:  Orders Placed This Encounter  Procedures   XR Finger Little Right   No orders of the defined types were placed in this encounter.     Procedures: No procedures performed   Clinical Data: No additional findings.   Subjective: Chief Complaint  Patient presents with   Right Hand - Follow-up    This is a 75 year old right-hand-dominant female who presents for follow up of a right small finger injury.  Her injury occurred on 10/9 at which time she was out to lunch and fell.  She had evidence of a central slip injury with inability to extend through central slip with [facility] test.  She has been in a volar PIP extension splint with the DIP joint free.  Since our visit, she has developed an extensor lag at the DIP joint.  The pain at her PIP joint is minimal at this point.  She does have some dorsal skin irritation secondary to the splint.     Review of Systems   Objective: Vital Signs: BP 110/73 (BP Location: Left Arm, Patient Position: Sitting, Cuff Size: Large)    Pulse 60   Ht 5\' 3"  (1.6 m)   Wt 170 lb (77.1 kg)   SpO2 96%   BMI 30.11 kg/m   Physical Exam Constitutional:      Appearance: Normal appearance.  Cardiovascular:     Rate and Rhythm: Normal rate.     Pulses: Normal pulses.  Pulmonary:     Effort: Pulmonary effort is normal.  Skin:    General: Skin is warm and dry.     Capillary Refill: Capillary refill takes less than 2 seconds.  Neurological:     Mental Status: She is alert.    Left Hand Exam   Tenderness  Left hand tenderness location: Minimal TTP at PIP joint.   Other  Erythema: present Sensation: normal Pulse: present  Comments:  Mild erythema at dorsal aspect of PIP joint. Minimal PIP joint swelling. No active DIP extension.      Specialty Comments:  No specialty comments available.  Imaging: No results found.   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 No pertinent family history.  Past Surgical History:  Procedure Laterality Date   APPENDECTOMY     COLONOSCOPY     DILATION AND CURETTAGE OF UTERUS     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   Procedure: TOTAL KNEE ARTHROPLASTY;  Surgeon: Nadara Mustard, MD;  Location: [facility] OR;  Service: Orthopedics;  Laterality: Right;   Social History   Occupational History   Not on file  Tobacco Use   Smoking status: Never   Smokeless tobacco: Never  Substance and Sexual Activity   Alcohol use: No   Drug use: No   Sexual activity: Not on file

## 2021-01-24 NOTE — Therapy (Signed)
[facility] Physical Therapy [address] [place], [place], [address] Phone: [phone]   Fax:  [phone]  Occupational Therapy Treatment  Patient Details  Name: Natalie Whitney MRN: [id number] Date of Birth: 1945-11-07 Referring Provider (OT): Dr Frazier Butt   Encounter Date: 01/24/2021   OT End of Session - 01/24/21 1628     Visit Number 2    Number of Visits 4    Date for OT Re-Evaluation 01/24/21   Every 10th visit   Authorization Type Medicare A & B and [employer] Medicare suppliment    Authorization - Number of Visits 2    OT Start Time 1545    OT Stop Time 1615    OT Time Calculation (min) 30 min    Activity Tolerance Patient tolerated treatment well    Behavior During Therapy [facility] for tasks assessed/performed             Past Medical History:  Diagnosis Date   Arthritis    Diabetes mellitus without complication (HCC)    Family history of adverse reaction to anesthesia    daughter has nausea   Heart murmur    History of hiatal hernia    Hypertension    Sleep apnea    stopped cpap    Past Surgical History:  Procedure Laterality Date   APPENDECTOMY     COLONOSCOPY     DILATION AND CURETTAGE OF UTERUS     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   Procedure: TOTAL KNEE ARTHROPLASTY;  Surgeon: Nadara Mustard, MD;  Location: [facility] OR;  Service: Orthopedics;  Laterality: Right;    There were no vitals filed for this visit.   Subjective Assessment - 01/24/21 1618     Subjective  Pt sustained a boutonniere deformity 12/16/20, she presents today following her appointment with Dr Frazier Butt for protective splinting secondary to DIP lag of ~45* since her last visit.    Patient is accompanied by: Family member   Pt sister   Patient Stated Goals Get hand use again    Currently in Pain? Yes    Pain Score 3     Pain Location Finger (Comment which one)    Pain Orientation Right    Pain Descriptors / Indicators Sore    Pain Type Acute pain    Pain  Onset In the past 7 days    Pain Frequency Intermittent    Aggravating Factors  Bumping PIP R Sm finger    Pain Relieving Factors Rest, elevation    Multiple Pain Sites No                OT Treatments/Exercises (OP) - 01/24/21 0001       ADLs   ADL Comments Pt was educated in splining use, care and precautions. She will wear the splint at all times and wrap her hand for ADL's where it may get wet. Pt is free for A/ROM to MCP with her PIP/DIP in full extension of her small finger as per Dr Frazier Butt orders. Both pt and her sister verbalized understanding of using her hand for ADL's and functional use with the splint on for the next 6 weeks. She will call any questions or concerns at any time.      Exercises   Exercises --   Active MCP flexion/extension to her right small finger only. Wear splint at all times     Splinting   Splinting Re- assessment today: Pt was fitted with a custom volar extension splint for her R  small finger following an extensor lag to her DIP since her last appointment. Her small finger PIP is in extension and her DIPJ is slightly hyperextended. She was educated in splinting use, care and precautions and will call within the next 4-6 weeks should she need any adjustments.                    OT Education - 01/24/21 1627     Education Details Splinting use, care and precautions R small finger. wear splint at all times.    Person(s) Educated Patient;Other (comment)   sister   Methods Explanation;Demonstration;Verbal cues    Comprehension Verbalized understanding;Need further instruction              OT Short Term Goals - 01/24/21 1634       OT SHORT TERM GOAL #1   Title STG's = LTG's Pt wil lbe Mod I splinting use, care and precautions R small finger    Time 4    Period Weeks    Status On-going    Target Date 02/27/21      OT SHORT TERM GOAL #2   Title Pt will be Mod I edema control techniques R hand/finger    Time 4    Period Weeks     Status Achieved    Target Date 01/24/21               OT Long Term Goals - 01/24/21 1634       OT LONG TERM GOAL #1   Title STG's = LTG's                   Plan - 01/24/21 1629     Clinical Impression Statement Pt presented to Dr Georgia Duff office for her 4 week f/u today and it was noted that she had about a -45* lag at her right small finger DIP. She was re-assessed and a new volar extension splint was fabricated placing her right small finger in full extension at the PIP and slight hyper-extension at the DIP. She and her sister were educated in splinting use, care and precautions and will call at any time in the next 4 weeks should she require any adjustments or have any concerns. She is going to schedule f/u with Dr Frazier Butt 1 month from today.    OT Occupational Profile and History Problem Focused Assessment - Including review of records relating to presenting problem    Occupational performance deficits (Please refer to evaluation for details): ADL's    Body Structure / Function / Physical Skills UE functional use;ADL;Pain;Dexterity;Edema;ROM;Flexibility;Coordination;FMC;Decreased knowledge of precautions    Rehab Potential Fair   splint eval only   Clinical Decision Making Limited treatment options, no task modification necessary    Comorbidities Affecting Occupational Performance: None    Modification or Assistance to Complete Evaluation  No modification of tasks or assist necessary to complete eval    OT Frequency Other (comment)   Splint eval with up to 3 additional visits PRN   OT Duration Other (comment)   See above   OT Treatment/Interventions Splinting    Plan Pt may f/u an additional 1-2 visits for splinting check/adjustments PRN over next 4 weeks. Pt to f/u with Dr Frazier Butt sometime before Christmas for progression of program.    Consulted and Agree with Plan of Care Patient;Other (Comment)   Sister            Patient will benefit from skilled  therapeutic intervention in order  to improve the following deficits and impairments:   Body Structure / Function / Physical Skills: UE functional use, ADL, Pain, Dexterity, Edema, ROM, Flexibility, Coordination, FMC, Decreased knowledge of precautions       Visit Diagnosis: Pain in right finger(s)  Other lack of coordination  Stiffness of right hand, not elsewhere classified  Localized edema  Muscle weakness (generalized)    Problem List Patient Active Problem List   Diagnosis Date Noted   Central slip extensor tendon injury (boutonniere), right 12/20/2020   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 [place], [place], [address] Phone: [phone]   Fax:  [phone]  Name: NIDA MANFREDI MRN: [id number] Date of Birth: [birth year]

## 2021-02-06 ENCOUNTER — Ambulatory Visit (INDEPENDENT_AMBULATORY_CARE_PROVIDER_SITE_OTHER): Payer: Medicare Other | Admitting: Occupational Therapy

## 2021-02-06 ENCOUNTER — Encounter: Payer: Self-pay | Admitting: Occupational Therapy

## 2021-02-06 ENCOUNTER — Other Ambulatory Visit: Payer: Self-pay

## 2021-02-06 DIAGNOSIS — M79644 Pain in right finger(s): Secondary | ICD-10-CM | POA: Diagnosis not present

## 2021-02-06 DIAGNOSIS — M6281 Muscle weakness (generalized): Secondary | ICD-10-CM

## 2021-02-06 DIAGNOSIS — R600 Localized edema: Secondary | ICD-10-CM | POA: Diagnosis not present

## 2021-02-06 DIAGNOSIS — M25641 Stiffness of right hand, not elsewhere classified: Secondary | ICD-10-CM

## 2021-02-06 DIAGNOSIS — R278 Other lack of coordination: Secondary | ICD-10-CM | POA: Diagnosis not present

## 2021-02-06 NOTE — Therapy (Addendum)
[facility] Physical Therapy [address] [place], [place], [address] Phone: [phone]   Fax:  [phone]  Occupational Therapy Treatment & DISCHARGE NOTE  Patient Details  Name: Natalie Whitney MRN: [id number] Date of Birth: December 29, 1945 Referring Provider (OT): Dr Frazier Butt   Encounter Date: 02/06/2021   OT End of Session - 02/06/21 1214     Visit Number 3    Number of Visits 4    Date for OT Re-Evaluation 02/28/21    Authorization Type Medicare A & B and [employer] Medicare suppliment    Authorization - Number of Visits 3    OT Start Time 1140    OT Stop Time 1156    OT Time Calculation (min) 16 min    Activity Tolerance Patient tolerated treatment well    Behavior During Therapy [facility] for tasks assessed/performed             Past Medical History:  Diagnosis Date   Arthritis    Diabetes mellitus without complication (HCC)    Family history of adverse reaction to anesthesia    daughter has nausea   Heart murmur    History of hiatal hernia    Hypertension    Sleep apnea    stopped cpap    Past Surgical History:  Procedure Laterality Date   APPENDECTOMY     COLONOSCOPY     DILATION AND CURETTAGE OF UTERUS     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   Procedure: TOTAL KNEE ARTHROPLASTY;  Surgeon: Nadara Mustard, MD;  Location: [facility] OR;  Service: Orthopedics;  Laterality: Right;    There were no vitals filed for this visit.   Subjective Assessment - 02/06/21 1158     Subjective  Pt presents to out-pt OT today for splint adjustment after having spoken with Dr Frazier Butt. She is w/ redness and some minor maceration at her PIPJ and he verbally discussed trimming her splint back to no longer include the PIP.    Patient is accompanied by: Family member   Sister   Patient Stated Goals Get hand use again    Currently in Pain? Yes    Pain Score 2     Pain Location Finger (Comment which one)    Pain Orientation Right    Pain Descriptors / Indicators  Sore    Pain Type Acute pain    Pain Onset In the past 7 days    Pain Frequency Intermittent    Aggravating Factors  bumping her right small finger,    Pain Relieving Factors rest, elevation    Multiple Pain Sites No                          OT Treatments/Exercises (OP) - 02/06/21 0001       ADLs   ADL Comments Pt was reassessed today following a conversation with Dr Frazier Butt. She is Mod I splinting and edema management however, her splint needed to be adjusted secondary to reddness at the right small finger PIP. Her splint was trimmed back to include only the DIP per Dr Frazier Butt. She was educated in mallet splining use, care and precautions. She will wear the splint at all times and wrap her hand for ADL's where it may get wet. Pt is free for A/ROM to MCP, PIP & DIP is in slight hyper- extension of her small finger as per Dr Frazier Butt verbal orders. Both pt and her sister verbalized understanding of using her hand for ADL's  and functional use with the splint on until she follows up with Dr Frazier Butt at her regularly scheduled appointment in about 2 more weeks. She will call any questions or concerns at any time.      Splinting   Splinting Pt splint was adjusted from a custom volar PIP/DIP extension splint as fabricated on 01/24/21, to a mallet splint that leaves her PIP free for A/ROM and slight hyperextension at her DIP. This splint was adjusted following a verbal conversation with Dr Frazier Butt yesterday between the patient and then seperately with this therapist as pt was with noted reddness and irritation to her PIP. She was educated in splinting use, care and precautions and will wear at all times. She will call with any questions or concerns at any time should she need any adjustments. She will follow up with Dr Frazier Butt in about 2 weeks per her report.                    OT Education - 02/06/21 1213     Education Details Splinting use, care and precautions R small  finger. wear mallet splint at all times right small finger.    Person(s) Educated Patient;Other (comment)   Sister   Methods Explanation;Demonstration;Verbal cues    Comprehension Verbalized understanding;Need further instruction              OT Short Term Goals - 02/06/21 1222       OT SHORT TERM GOAL #1   Title STG's = LTG's Pt wil lbe Mod I splinting use, care and precautions R small finger    Time 4    Period Weeks    Status On-going    Target Date 02/27/21      OT SHORT TERM GOAL #2   Title Pt will be Mod I edema control techniques R hand/finger    Time 4    Period Weeks    Status Achieved    Target Date 01/24/21               OT Long Term Goals - 01/24/21 1634       OT LONG TERM GOAL #1   Title STG's = LTG's                   Plan - 02/06/21 1216     Clinical Impression Statement Pt presents to out-pt OT today for splint adjustment after having spoken with Dr Frazier Butt on 11/28 or 02/05/21. Dr Frazier Butt noted that pt was w/ redness and some minor maceration at her PIPJ and he verbally discussed trimming her right small voler extension splint back on 02/05/21 with OT/hand therapist, to no longer include the PIPJ. Her splint was adjusted to include her right small finger DIP only. Her DIP is in slight hyperextension as per mallet finger splinting. She was educated in splinting use, care and precautions and will call at any time with questions, concerns or need for adjustements. She plans to f/u with Dr Frazier Butt in about 2 weeks per her report.    OT Occupational Profile and History Problem Focused Assessment - Including review of records relating to presenting problem    Occupational performance deficits (Please refer to evaluation for details): ADL's    Body Structure / Function / Physical Skills UE functional use;ADL;Pain;Dexterity;Edema;ROM;Flexibility;Coordination;FMC;Decreased knowledge of precautions    Rehab Potential Fair    Clinical Decision  Making Limited treatment options, no task modification necessary    Comorbidities Affecting Occupational Performance: None  Modification or Assistance to Complete Evaluation  No modification of tasks or assist necessary to complete eval    OT Frequency Other (comment)   Splint eval with up to 3 additional vsits   OT Duration Other (comment)   see above   OT Treatment/Interventions Splinting    Plan Pt may f/u an additional 1-2 visits for splinting check/adjustments PRN over next 4 weeks. Pt to f/u with Dr Frazier Butt sometime before Christmas for progression of program.    Consulted and Agree with Plan of Care Patient;Other (Comment)   Sister            Patient will benefit from skilled therapeutic intervention in order to improve the following deficits and impairments:   Body Structure / Function / Physical Skills: UE functional use, ADL, Pain, Dexterity, Edema, ROM, Flexibility, Coordination, FMC, Decreased knowledge of precautions       Visit Diagnosis: Pain in right finger(s)  Other lack of coordination  Stiffness of right hand, not elsewhere classified  Localized edema  Muscle weakness (generalized)    Problem List Patient Active Problem List   Diagnosis Date Noted   Central slip extensor tendon injury (boutonniere), right 12/20/2020   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 [place], [place], [address] Phone: [phone]   Fax:  [phone]  Name: Natalie Whitney MRN: [id number] Date of Birth: 1945-08-20  OCCUPATIONAL THERAPY DISCHARGE SUMMARY  Visits from Start of Care: 3  Current functional level related to goals / functional outcomes: Pt did not return after 3rd visit and goals couldn't be addressed.   Remaining deficits: Pt did not return after 3rd visit and goals couldn't be addressed.   Education / Equipment: Pt has was given  Proofreader and education. See tx notes for more details.   Patient is discharge due to not returning to therapy or getting in contact for new visits, etc.  Angeni Chaudhuri, OTR/L, CHT 05/22/21

## 2021-02-11 ENCOUNTER — Ambulatory Visit (INDEPENDENT_AMBULATORY_CARE_PROVIDER_SITE_OTHER): Payer: Medicare Other | Admitting: Orthopedic Surgery

## 2021-02-11 ENCOUNTER — Other Ambulatory Visit: Payer: Self-pay

## 2021-02-11 DIAGNOSIS — M20021 Boutonniere deformity of right finger(s): Secondary | ICD-10-CM

## 2021-02-11 MED ORDER — CEPHALEXIN 500 MG PO CAPS: 500.0000 mg | ORAL_CAPSULE | Freq: Four times a day (QID) | ORAL | 0 refills | Status: AC

## 2021-02-11 NOTE — Progress Notes (Signed)
Office Visit Note   Patient: Natalie Whitney           Date of Birth: 1945/09/08           MRN: [id number] Visit Date: 02/11/2021              Requested by: Alinda Deem, MD [address] [place],  [place] [address] PCP: Alinda Deem, MD   Assessment & Plan: Visit Diagnoses:  1. Central slip extensor tendon injury (boutonniere), right     Plan: Patient has been in an extension splint at the PIP joint for 7 weeks.  She still has a 30-45 degree extensor lag with a 10 deg flexion contracture despite full time splinting.  The skin on the dorsum of the PIP joint is erythematous and tender.  She has no deformity at the DIP joint.  We discussed the potential next steps in treatment including continued therapy to gain full extension with continued extension splinting with possible central slip delayed repair versus boutonniere reconstruction once full extension regained.  We also discussed potential arthrodesis of the PIP joint.  The other treatment option was to continue to monitor her symptoms and see how she does with this current deformity given her age and activity level.  She is not very excited about being back in a split.  At this point, she wants to just see how she does with this deformity and see if it affects her overall function. The erythema and swelling on the dorsum of her PIP joint seems to be from irritation from the splint.  I will give her a prescription for keflex for possible cellulitis.  She and her sister will keep me updated in the next week or two regarding the condition of her skin and how she's doing functionally with this finger.   Follow-Up Instructions: No follow-ups on file.   Orders:  No orders of the defined types were placed in this encounter.  Meds ordered this encounter  Medications   cephALEXin (KEFLEX) 500 MG capsule    Sig: Take 1 capsule (500 mg total) by mouth 4 (four) times daily for 7 days.    Dispense:  28 capsule    Refill:  0       Procedures: No procedures performed   Clinical Data: No additional findings.   Subjective: Chief Complaint  Patient presents with   Right Little Finger - Follow-up, Pain, Edema    This is a 75 yo RHD F who presents for follow up of a Boutonniere deformity of the right small finger.  She has been in a splint for 7-8 weeks at this point.  She still has an extensor lag with 10-15 degree flexion contracture.  The dorsum of her finger is swollen and erythematous presumably from the strap of her extension splint.  She has no pain w/ attempted ROM of this finger.  She denies any new injury to the finger.    Review of Systems   Objective: Vital Signs: There were no vitals taken for this visit.  Physical Exam Constitutional:      Appearance: Normal appearance.  Cardiovascular:     Rate and Rhythm: Normal rate.     Pulses: Normal pulses.  Pulmonary:     Effort: Pulmonary effort is normal.  Skin:    General: Skin is warm and dry.     Capillary Refill: Capillary refill takes less than 2 seconds.  Neurological:     Mental Status: She is alert.    Right  Hand Exam   Tenderness  Right hand tenderness location: TTP over the skin of the small finger proximal phalanx.  Other  Sensation: normal Pulse: present  Comments:  Erythema and swelling of the small finger PIP joint.  45-50 deg extensor lag at the PIP joint with 10-15 degree flexion contracture.  No deformity at DIP joint.      Specialty Comments:  No specialty comments available.  Imaging: No results found.   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  Past Surgical History:  Procedure Laterality Date   APPENDECTOMY     COLONOSCOPY     DILATION AND CURETTAGE OF UTERUS     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   Procedure: TOTAL KNEE ARTHROPLASTY;  Surgeon: Nadara Mustard, MD;  Location: [facility] OR;  Service: Orthopedics;  Laterality: Right;   Social History   Occupational History   Not on file  Tobacco Use   Smoking status: Never   Smokeless tobacco: Never  Substance and Sexual Activity   Alcohol use: No   Drug use: No   Sexual activity: Not on file

## 2021-02-19 ENCOUNTER — Encounter: Payer: Self-pay | Admitting: Orthopedic Surgery

## 2021-02-19 ENCOUNTER — Other Ambulatory Visit: Payer: Self-pay

## 2021-02-19 ENCOUNTER — Ambulatory Visit (INDEPENDENT_AMBULATORY_CARE_PROVIDER_SITE_OTHER): Payer: Medicare Other

## 2021-02-19 ENCOUNTER — Ambulatory Visit (INDEPENDENT_AMBULATORY_CARE_PROVIDER_SITE_OTHER): Payer: Medicare Other | Admitting: Orthopedic Surgery

## 2021-02-19 VITALS — BP 111/74 | HR 64

## 2021-02-19 DIAGNOSIS — M20021 Boutonniere deformity of right finger(s): Secondary | ICD-10-CM

## 2021-02-21 NOTE — Progress Notes (Signed)
Office Visit Note   Patient: Natalie Whitney           Date of Birth: 19-Jun-1945           MRN: [id number] Visit Date: 02/19/2021              Requested by: Alinda Deem, MD [address] [place],  [place] [address] PCP: Alinda Deem, MD   Assessment & Plan: Visit Diagnoses:  1. Central slip extensor tendon injury (boutonniere), right     Plan: Patient still has an approximately 50-60 degree extensor lag with 20-30 deg flexion contracture despite splinting the PIP in extension for 6-8 weeks.  The tenderness at the dorsal aspect of the joint has improved.  We discussed two treatment options from this point.  One is joint arthrodesis which would provide the shortest recovery period and would not requite extensive therapy.  The other option would be continued therapy to try to regain full extension followed by a central slip reconstruction procedure.  Discussed that this would be a long recovery process, require more therapy, and is not guaranteed to produce a finger without any lag or contracture.  She wants to think about her options.   Follow-Up Instructions: No follow-ups on file.   Orders:  Orders Placed This Encounter  Procedures   XR Finger Little Right   No orders of the defined types were placed in this encounter.     Procedures: No procedures performed   Clinical Data: No additional findings.   Subjective: Chief Complaint  Patient presents with   Right Hand - Follow-up    This is a 75 yo RHD F who presents for follow up of a Boutonniere deformity of the right small finger.  She was splinted with the PIP joint in extension for 7-8 weeks She still has an extensor lag with 20-30 degree flexion contracture.  The dorsum of her finger is swollen and erythematous but improved.  She has no pain w/ attempted ROM of this finger.     Review of Systems   Objective: Vital Signs: BP 111/74 (BP Location: Left Arm, Patient Position: Sitting, Cuff Size: Normal)    Pulse 64     SpO2 97%   Physical Exam Constitutional:      Appearance: Normal appearance.  Cardiovascular:     Rate and Rhythm: Normal rate.     Pulses: Normal pulses.  Pulmonary:     Effort: Pulmonary effort is normal.  Skin:    General: Skin is warm and dry.     Capillary Refill: Capillary refill takes less than 2 seconds.  Neurological:     Mental Status: She is alert.    Right Hand Exam   Tenderness  Right hand tenderness location: TTP at ulnar aspect of small finger PIP joint.  Other  Erythema: present Sensation: normal Pulse: present  Comments:  Finger resting in approximately 50-60 degrees of flexion with extensor lag and approximately 20 deg flexion contracture.  Erythema at dorsum of joint appears improved.      Specialty Comments:  No specialty comments available.  Imaging: 3V of the right small finger taken today are reviewed and interpreted by me.  They demonstrate flexed position of the PIP joint.  There is a small displaced bony fragment at the level of the PIP joint and another fragment at the level of the middle phalangeal shaft.    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 No pertinent family history.  Past Surgical History:  Procedure Laterality Date   APPENDECTOMY     COLONOSCOPY     DILATION AND CURETTAGE OF UTERUS     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   Procedure: TOTAL KNEE ARTHROPLASTY;  Surgeon: Nadara Mustard, MD;  Location: [facility] OR;  Service: Orthopedics;  Laterality: Right;   Social History   Occupational History    Not on file  Tobacco Use   Smoking status: Never   Smokeless tobacco: Never  Substance and Sexual Activity   Alcohol use: No   Drug use: No   Sexual activity: Not on file

## 2021-03-05 ENCOUNTER — Other Ambulatory Visit: Payer: Self-pay | Admitting: Orthopedic Surgery

## 2021-03-26 ENCOUNTER — Other Ambulatory Visit: Payer: Self-pay

## 2021-03-26 ENCOUNTER — Ambulatory Visit (INDEPENDENT_AMBULATORY_CARE_PROVIDER_SITE_OTHER): Payer: Medicare Other | Admitting: Orthopedic Surgery

## 2021-03-26 DIAGNOSIS — M20021 Boutonniere deformity of right finger(s): Secondary | ICD-10-CM | POA: Diagnosis not present

## 2021-03-27 NOTE — H&P (View-Only) (Signed)
Office Visit Note   Patient: Natalie Whitney           Date of Birth: 1946-02-10           MRN: [id number] Visit Date: 03/26/2021              Requested by: Alinda Deem, MD [address] [place],  [place] [address] PCP: Alinda Deem, MD   Assessment & Plan: Visit Diagnoses:  1. 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  That would avoid prolonged therapy to regain ROM, central slip reconstruction with questionable soft tissue quality, and lengthy postop rehab course.  She is unhappy with the finger in its current posture of 70-80 degrees flexion.  Discussed that she would be fused in a more functional position.  She and her sister want to think about it at home.  We will discuss the issue further over the phone.   Follow-Up Instructions: No follow-ups on file.   Orders:  No orders of the defined types were placed in this encounter.  No orders of the defined types were placed in this encounter.     Procedures: No procedures performed   Clinical Data: No additional findings.   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  She was splinted with the PIP joint in extension and MP/DIP joints free for 8 weeks.  Unfortunately, conservative management has failed.  She previously wanted to leave the finger alone for a period to see if she could live with the deformity but is unhappy with it's current posture.  The finger sits in approximately 70-80 degrees of flexion at the PIP joint with 45 deg flexion  contracture. She has no pain in the finger at this point.    Review of Systems   Objective: Vital Signs: There were no vitals taken for this visit.  Physical Exam  Right Hand Exam   Tenderness  The patient is experiencing no tenderness.   Other  Erythema: absent Sensation: normal Pulse: present  Comments:  Finger held in approximately 70-80 degrees of flexion.  Can be passively corrected to 45 deg of flexion.  No active extension. PIP joint remains swollen but not painful.      Specialty Comments:  No specialty comments available.  Imaging: No results found.   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  Past Surgical History:  Procedure Laterality Date   APPENDECTOMY     COLONOSCOPY     DILATION  AND CURETTAGE OF UTERUS    ° 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  ° Procedure: TOTAL KNEE ARTHROPLASTY;  Surgeon: Marcus Duda V, MD;  Location: [facility] OR;  Service: Orthopedics;  Laterality: Right;  ° °Social History  ° °Occupational History  ° Not on file  °Tobacco Use  ° Smoking status: Never  ° Smokeless tobacco: Never  °Substance and Sexual Activity  ° Alcohol use: No  ° Drug use: No  ° Sexual activity: Not on file  ° ° ° ° ° ° °

## 2021-03-27 NOTE — Progress Notes (Signed)
Office Visit Note   Patient: Natalie Whitney           Date of Birth: 1946-02-10           MRN: [id number] Visit Date: 03/26/2021              Requested by: Alinda Deem, MD [address] [place],  [place] [address] PCP: Alinda Deem, MD   Assessment & Plan: Visit Diagnoses:  1. 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  That would avoid prolonged therapy to regain ROM, central slip reconstruction with questionable soft tissue quality, and lengthy postop rehab course.  She is unhappy with the finger in its current posture of 70-80 degrees flexion.  Discussed that she would be fused in a more functional position.  She and her sister want to think about it at home.  We will discuss the issue further over the phone.   Follow-Up Instructions: No follow-ups on file.   Orders:  No orders of the defined types were placed in this encounter.  No orders of the defined types were placed in this encounter.     Procedures: No procedures performed   Clinical Data: No additional findings.   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  She was splinted with the PIP joint in extension and MP/DIP joints free for 8 weeks.  Unfortunately, conservative management has failed.  She previously wanted to leave the finger alone for a period to see if she could live with the deformity but is unhappy with it's current posture.  The finger sits in approximately 70-80 degrees of flexion at the PIP joint with 45 deg flexion  contracture. She has no pain in the finger at this point.    Review of Systems   Objective: Vital Signs: There were no vitals taken for this visit.  Physical Exam  Right Hand Exam   Tenderness  The patient is experiencing no tenderness.   Other  Erythema: absent Sensation: normal Pulse: present  Comments:  Finger held in approximately 70-80 degrees of flexion.  Can be passively corrected to 45 deg of flexion.  No active extension. PIP joint remains swollen but not painful.      Specialty Comments:  No specialty comments available.  Imaging: No results found.   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  Past Surgical History:  Procedure Laterality Date   APPENDECTOMY     COLONOSCOPY     DILATION  AND CURETTAGE OF UTERUS     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   Procedure: TOTAL KNEE ARTHROPLASTY;  Surgeon: Nadara Mustard, MD;  Location: [facility] OR;  Service: Orthopedics;  Laterality: Right;   Social History   Occupational History   Not on file  Tobacco Use   Smoking status: Never   Smokeless tobacco: Never  Substance and Sexual Activity   Alcohol use: No   Drug use: No   Sexual activity: Not on file

## 2021-04-02 ENCOUNTER — Other Ambulatory Visit: Payer: Self-pay | Admitting: Family

## 2021-04-12 ENCOUNTER — Telehealth: Payer: Self-pay | Admitting: Orthopedic Surgery

## 2021-04-12 DIAGNOSIS — M545 Low back pain, unspecified: Secondary | ICD-10-CM

## 2021-04-12 DIAGNOSIS — M79604 Pain in right leg: Secondary | ICD-10-CM

## 2021-04-12 MED ORDER — PREDNISONE 50 MG PO TABS: ORAL_TABLET | ORAL | 0 refills | Status: DC

## 2021-04-12 NOTE — Telephone Encounter (Signed)
Per office note 11/27/20 do you want to set up MRi and follow up with [facility] for possible ESI?

## 2021-04-12 NOTE — Telephone Encounter (Signed)
Sent course of prednisone   Also ordered MRI and referral to [facility] for ESI and mri review, hopefully she'll have scan before sees [facility] about this pain

## 2021-04-12 NOTE — Telephone Encounter (Signed)
I called and sw pt to advise of plan. She is sch to have surgery with Dr. Tempie Donning and states that she may not be able to  do this before surgery. Advised that is ok she can do this when she is able but in the mean time she can take the prednisone to see if this will offer some relief

## 2021-04-12 NOTE — Telephone Encounter (Signed)
Patient called. Says her right leg is hurting badly nothing is helping. I asked her to come in next week to see Erin. She says she has no way here. Would like to know what to do. [phone]

## 2021-04-16 ENCOUNTER — Telehealth: Payer: Self-pay | Admitting: Orthopedic Surgery

## 2021-04-16 NOTE — Progress Notes (Signed)
Surgical Instructions    Your procedure is scheduled on 04/22/21.  Report to [facility] Main Entrance "A" at 10:15 A.M., then check in with the Admitting office.  Call this number if you have problems the morning of surgery:  [phone]   If you have any questions prior to your surgery date call [phone]: Open Monday-Friday 8am-4pm    Remember:  Do not eat after midnight the night before your surgery  You may drink clear liquids until 9:15 the morning of your surgery.   Clear liquids allowed are: Water, Non-Citrus Juices (without pulp), Carbonated Beverages, Clear Tea, Black Coffee ONLY (NO MILK, CREAM OR POWDERED CREAMER of any kind), and Gatorade    Take these medicines the morning of surgery with A SIP OF WATER:  atorvastatin (LIPITOR) liothyronine (CYTOMEL)  AS NEEDED: loratadine (CLARITIN) predniSONE (DELTASONE)   As of today, STOP taking any Aspirin (unless otherwise instructed by your surgeon) Aleve, Naproxen, Ibuprofen, Motrin, Advil, Goody's, BC's, all herbal medications, fish oil, and all vitamins.  WHAT DO I DO ABOUT MY DIABETES MEDICATION?   Do not take oral diabetes medicines (pills) the morning of surgery.  DO NOT take Jardiance the day before surgery or the day of surgery.   The day of surgery, do not take other diabetes injectables, including OZEMPIC, Byetta (exenatide), Bydureon (exenatide ER), Victoza (liraglutide), or Trulicity (dulaglutide).    HOW TO MANAGE YOUR DIABETES BEFORE AND AFTER SURGERY  Why is it important to control my blood sugar before and after surgery? Improving blood sugar levels before and after surgery helps healing and can limit problems. A way of improving blood sugar control is eating a healthy diet by:  Eating less sugar and carbohydrates  Increasing activity/exercise  Talking with your doctor about reaching your blood sugar goals High blood sugars (greater than 180 mg/dL) can raise your risk of infections and slow your  recovery, so you will need to focus on controlling your diabetes during the weeks before surgery. Make sure that the doctor who takes care of your diabetes knows about your planned surgery including the date and location.  How do I manage my blood sugar before surgery? Check your blood sugar at least 4 times a day, starting 2 days before surgery, to make sure that the level is not too high or low.  Check your blood sugar the morning of your surgery when you wake up and every 2 hours until you get to the Short Stay unit.  If your blood sugar is less than 70 mg/dL, you will need to treat for low blood sugar: Do not take insulin. Treat a low blood sugar (less than 70 mg/dL) with  cup of clear juice (cranberry or apple), 4 glucose tablets, OR glucose gel. Recheck blood sugar in 15 minutes after treatment (to make sure it is greater than 70 mg/dL). If your blood sugar is not greater than 70 mg/dL on recheck, call [phone] for further instructions. Report your blood sugar to the short stay nurse when you get to Short Stay.  If you are admitted to the hospital after surgery: Your blood sugar will be checked by the staff and you will probably be given insulin after surgery (instead of oral diabetes medicines) to make sure you have good blood sugar levels. The goal for blood sugar control after surgery is 80-180 mg/dL.            Do not wear jewelry or makeup Do not wear lotions, powders, perfumes or deodorant. Do not  shave 48 hours prior to surgery.   Do not bring valuables to the hospital. Do not wear nail polish, gel polish, artificial nails, or any other type of covering on natural nails (fingers and toes) If you have artificial nails or gel coating that need to be removed by a nail salon, please have this removed prior to surgery. Artificial nails or gel coating may interfere with anesthesia's ability to adequately monitor your vital signs.  [facility] is not responsible for any  belongings or valuables. .   Do NOT Smoke (Tobacco/Vaping)  24 hours prior to your procedure  If you use a CPAP at night, you may bring your mask for your overnight stay.   Contacts, glasses, hearing aids, dentures or partials may not be worn into surgery, please bring cases for these belongings   For patients admitted to the hospital, discharge time will be determined by your treatment team.   Patients discharged the day of surgery will not be allowed to drive home, and someone needs to stay with them for 24 hours.  NO VISITORS WILL BE ALLOWED IN PRE-OP WHERE PATIENTS ARE PREPPED FOR SURGERY.  ONLY 1 SUPPORT PERSON MAY BE PRESENT IN THE WAITING ROOM WHILE YOU ARE IN SURGERY.  IF YOU ARE TO BE ADMITTED, ONCE YOU ARE IN YOUR ROOM YOU WILL BE ALLOWED TWO (2) VISITORS. 1 (ONE) VISITOR MAY STAY OVERNIGHT BUT MUST ARRIVE TO THE ROOM BY 8pm.  Minor children may have two parents present. Special consideration for safety and communication needs will be reviewed on a case by case basis.  Special instructions:    Oral Hygiene is also important to reduce your risk of infection.  Remember - BRUSH YOUR TEETH THE MORNING OF SURGERY WITH YOUR REGULAR TOOTHPASTE   [facility]- Preparing For Surgery  Before surgery, you can play an important role. Because skin is not sterile, your skin needs to be as free of germs as possible. You can reduce the number of germs on your skin by washing with CHG (chlorahexidine gluconate) Soap before surgery.  CHG is an antiseptic cleaner which kills germs and bonds with the skin to continue killing germs even after washing.     Please do not use if you have an allergy to CHG or antibacterial soaps. If your skin becomes reddened/irritated stop using the CHG.  Do not shave (including legs and underarms) for at least 48 hours prior to first CHG shower. It is OK to shave your face.  Please follow these instructions carefully.     Shower the NIGHT BEFORE SURGERY and the  MORNING OF SURGERY with CHG Soap.   If you chose to wash your hair, wash your hair first as usual with your normal shampoo. After you shampoo, rinse your hair and body thoroughly to remove the shampoo.  Then [employer] and genitals (private parts) with your normal soap and rinse thoroughly to remove soap.  After that Use CHG Soap as you would any other liquid soap. You can apply CHG directly to the skin and wash gently with a scrungie or a clean washcloth.   Apply the CHG Soap to your body ONLY FROM THE NECK DOWN.  Do not use on open wounds or open sores. Avoid contact with your eyes, ears, mouth and genitals (private parts). Wash Face and genitals (private parts)  with your normal soap.   Wash thoroughly, paying special attention to the area where your surgery will be performed.  Thoroughly rinse your body with warm  water from the neck down.  DO NOT shower/wash with your normal soap after using and rinsing off the CHG Soap.  Pat yourself dry with a CLEAN TOWEL.  Wear CLEAN PAJAMAS to bed the night before surgery  Place CLEAN SHEETS on your bed the night before your surgery  DO NOT SLEEP WITH PETS.   Day of Surgery:  Take a shower with CHG soap. Wear Clean/Comfortable clothing the morning of surgery Do not apply any deodorants/lotions.   Remember to brush your teeth WITH YOUR REGULAR TOOTHPASTE.    COVID testing  If you are going to stay overnight or be admitted after your procedure/surgery and require a pre-op COVID test, please follow these instructions after your COVID test   You are not required to quarantine however you are required to wear a well-fitting mask when you are out and around people not in your household.  If your mask becomes wet or soiled, replace with a new one.  Wash your hands often with soap and water for 20 seconds or clean your hands with an alcohol-based hand sanitizer that contains at least 60% alcohol.  Do not share personal items.  Notify your  provider: if you are in close contact with someone who has COVID  or if you develop a fever of 100.4 or greater, sneezing, cough, sore throat, shortness of breath or body aches.    Please read over the following fact sheets that you were given.

## 2021-04-16 NOTE — Telephone Encounter (Signed)
Patient requesting 09/26/20 images on CD , she would like to pick up tomorrow afternoon. Callback [phone]

## 2021-04-16 NOTE — Telephone Encounter (Signed)
Called and advised patient CD was ready for pickup at front desk.  

## 2021-04-17 ENCOUNTER — Other Ambulatory Visit: Payer: Self-pay

## 2021-04-17 ENCOUNTER — Encounter (HOSPITAL_COMMUNITY)
Admission: RE | Admit: 2021-04-17 | Discharge: 2021-04-17 | Disposition: A | Discharge status: Home / Self Care | Payer: Medicare Other | Source: Ambulatory Visit | Attending: Orthopedic Surgery | Admitting: Orthopedic Surgery

## 2021-04-17 ENCOUNTER — Encounter (HOSPITAL_COMMUNITY): Payer: Self-pay

## 2021-04-17 VITALS — BP 116/52 | HR 60 | Temp 97.8°F | Resp 17 | Ht 64.0 in | Wt 182.8 lb

## 2021-04-17 DIAGNOSIS — I2510 Atherosclerotic heart disease of native coronary artery without angina pectoris: Secondary | ICD-10-CM | POA: Diagnosis not present

## 2021-04-17 DIAGNOSIS — I498 Other specified cardiac arrhythmias: Secondary | ICD-10-CM | POA: Diagnosis not present

## 2021-04-17 DIAGNOSIS — Z01818 Encounter for other preprocedural examination: Secondary | ICD-10-CM | POA: Diagnosis not present

## 2021-04-17 DIAGNOSIS — I10 Essential (primary) hypertension: Secondary | ICD-10-CM | POA: Insufficient documentation

## 2021-04-17 DIAGNOSIS — M20021 Boutonniere deformity of right finger(s): Secondary | ICD-10-CM | POA: Insufficient documentation

## 2021-04-17 DIAGNOSIS — G4733 Obstructive sleep apnea (adult) (pediatric): Secondary | ICD-10-CM | POA: Diagnosis not present

## 2021-04-17 DIAGNOSIS — E119 Type 2 diabetes mellitus without complications: Secondary | ICD-10-CM

## 2021-04-17 DIAGNOSIS — E1159 Type 2 diabetes mellitus with other circulatory complications: Secondary | ICD-10-CM | POA: Insufficient documentation

## 2021-04-17 DIAGNOSIS — Z981 Arthrodesis status: Secondary | ICD-10-CM | POA: Insufficient documentation

## 2021-04-17 HISTORY — DX: Pneumonia, unspecified organism: J18.9

## 2021-04-17 LAB — CBC
HCT: 46.3 % — ABNORMAL HIGH (ref 36.0–46.0)
Hemoglobin: 14.9 g/dL (ref 12.0–15.0)
MCH: 29.6 pg (ref 26.0–34.0)
MCHC: 32.2 g/dL (ref 30.0–36.0)
MCV: 92 fL (ref 80.0–100.0)
Platelets: 230 10*3/uL (ref 150–400)
RBC: 5.03 MIL/uL (ref 3.87–5.11)
RDW: 13.5 % (ref 11.5–15.5)
WBC: 7.7 10*3/uL (ref 4.0–10.5)
nRBC: 0 % (ref 0.0–0.2)

## 2021-04-17 LAB — BASIC METABOLIC PANEL
Anion gap: 8 (ref 5–15)
BUN: 34 mg/dL — ABNORMAL HIGH (ref 8–23)
CO2: 30 mmol/L (ref 22–32)
Calcium: 9.8 mg/dL (ref 8.9–10.3)
Chloride: 100 mmol/L (ref 98–111)
Creatinine, Ser: 0.99 mg/dL (ref 0.44–1.00)
GFR, Estimated: 59 mL/min — ABNORMAL LOW (ref >60)
Glucose, Bld: 129 mg/dL — ABNORMAL HIGH (ref 70–99)
Potassium: 4.6 mmol/L (ref 3.5–5.1)
Sodium: 138 mmol/L (ref 135–145)

## 2021-04-17 LAB — HEMOGLOBIN A1C
Hgb A1c MFr Bld: 5.7 % — ABNORMAL HIGH (ref 4.8–5.6)
Mean Plasma Glucose: 116.89 mg/dL

## 2021-04-17 LAB — GLUCOSE, CAPILLARY: Glucose-Capillary: 126 mg/dL — ABNORMAL HIGH (ref 70–99)

## 2021-04-17 NOTE — Progress Notes (Signed)
PCP - Alinda Deem Cardiologist - gary miller   PPM/ICD - denies   Chest x-ray - n/a EKG - 04/17/21 Stress Test - records requested ECHO - records requested Cardiac Cath - unsure -records requested  Sleep Study - +OSA CPAP - does not wear nightly since losing over 100 lbs  Fasting Blood Sugar - 100 Checks Blood Sugar once time a day  Patient instructed to hold all Aspirin, NSAID's, herbal medications, fish oil and vitamins 7 days prior to surgery.   ERAS Protcol -yes PRE-SURGERY Ensure or G2- g2 ordered and given  COVID TEST- ambulatory surgery, not needed   Anesthesia review: yes, cardiac history and records requested from gary miller at [facility]  Patient denies shortness of breath, fever, cough and chest pain at PAT appointment   All instructions explained to the patient, with a verbal understanding of the material. Patient agrees to go over the instructions while at home for a better understanding. Patient also instructed to self quarantine after being tested for COVID-19. The opportunity to ask questions was provided.

## 2021-04-17 NOTE — Progress Notes (Signed)
Surgical Instructions    Your procedure is scheduled on 04/22/21.  Report to [facility] Main Entrance "A" at 10:15 A.M., then check in with the Admitting office.  Call this number if you have problems the morning of surgery:  [phone]   If you have any questions prior to your surgery date call [phone]: Open Monday-Friday 8am-4pm    Remember:  Do not eat after midnight the night before your surgery  You may drink clear liquids until 9:15 the morning of your surgery.   Clear liquids allowed are: Water, Non-Citrus Juices (without pulp), Carbonated Beverages, Clear Tea, Black Coffee ONLY (NO MILK, CREAM OR POWDERED CREAMER of any kind), and Gatorade  Patient Instructions  The night before surgery:  No food after midnight. ONLY clear liquids after midnight   The day of surgery (if you have diabetes): Drink ONE (1) 12 oz G2 given to you in your pre admission testing appointment by 9:15am the morning of surgery. Drink in one sitting. Do not sip.  This drink was given to you during your hospital  pre-op appointment visit.  Nothing else to drink after completing the  12 oz bottle of G2.         If you have questions, please contact your surgeons office.     Take these medicines the morning of surgery with A SIP OF WATER:  atorvastatin (LIPITOR) liothyronine (CYTOMEL)  AS NEEDED: loratadine (CLARITIN) predniSONE (DELTASONE)   As of today, STOP taking any Aspirin (unless otherwise instructed by your surgeon) Aleve, Naproxen, Ibuprofen, Motrin, Advil, Goody's, BC's, all herbal medications, fish oil, and all vitamins.  WHAT DO I DO ABOUT MY DIABETES MEDICATION?   Do not take oral diabetes medicines (pills) the morning of surgery.  DO NOT take Jardiance the day before surgery or the day of surgery. DO NOT take metFORMIN (GLUCOPHAGE) the day of surgery.    The day of surgery, do not take other diabetes injectables, including OZEMPIC, Byetta (exenatide), Bydureon  (exenatide ER), Victoza (liraglutide), or Trulicity (dulaglutide).    HOW TO MANAGE YOUR DIABETES BEFORE AND AFTER SURGERY  Why is it important to control my blood sugar before and after surgery? Improving blood sugar levels before and after surgery helps healing and can limit problems. A way of improving blood sugar control is eating a healthy diet by:  Eating less sugar and carbohydrates  Increasing activity/exercise  Talking with your doctor about reaching your blood sugar goals High blood sugars (greater than 180 mg/dL) can raise your risk of infections and slow your recovery, so you will need to focus on controlling your diabetes during the weeks before surgery. Make sure that the doctor who takes care of your diabetes knows about your planned surgery including the date and location.  How do I manage my blood sugar before surgery? Check your blood sugar at least 4 times a day, starting 2 days before surgery, to make sure that the level is not too high or low.  Check your blood sugar the morning of your surgery when you wake up and every 2 hours until you get to the Short Stay unit.  If your blood sugar is less than 70 mg/dL, you will need to treat for low blood sugar: Do not take insulin. Treat a low blood sugar (less than 70 mg/dL) with  cup of clear juice (cranberry or apple), 4 glucose tablets, OR glucose gel. Recheck blood sugar in 15 minutes after treatment (to make sure it is greater than 70 mg/dL).  If your blood sugar is not greater than 70 mg/dL on recheck, call [phone] for further instructions. Report your blood sugar to the short stay nurse when you get to Short Stay.  If you are admitted to the hospital after surgery: Your blood sugar will be checked by the staff and you will probably be given insulin after surgery (instead of oral diabetes medicines) to make sure you have good blood sugar levels. The goal for blood sugar control after surgery is 80-180 mg/dL.             Do not wear jewelry or makeup Do not wear lotions, powders, perfumes or deodorant. Do not shave 48 hours prior to surgery.   Do not bring valuables to the hospital. Do not wear nail polish, gel polish, artificial nails, or any other type of covering on natural nails (fingers and toes) If you have artificial nails or gel coating that need to be removed by a nail salon, please have this removed prior to surgery. Artificial nails or gel coating may interfere with anesthesia's ability to adequately monitor your vital signs.  [facility] is not responsible for any belongings or valuables. .   Do NOT Smoke (Tobacco/Vaping)  24 hours prior to your procedure  If you use a CPAP at night, you may bring your mask for your overnight stay.   Contacts, glasses, hearing aids, dentures or partials may not be worn into surgery, please bring cases for these belongings   For patients admitted to the hospital, discharge time will be determined by your treatment team.   Patients discharged the day of surgery will not be allowed to drive home, and someone needs to stay with them for 24 hours.  NO VISITORS WILL BE ALLOWED IN PRE-OP WHERE PATIENTS ARE PREPPED FOR SURGERY.  ONLY 1 SUPPORT PERSON MAY BE PRESENT IN THE WAITING ROOM WHILE YOU ARE IN SURGERY.  IF YOU ARE TO BE ADMITTED, ONCE YOU ARE IN YOUR ROOM YOU WILL BE ALLOWED TWO (2) VISITORS. 1 (ONE) VISITOR MAY STAY OVERNIGHT BUT MUST ARRIVE TO THE ROOM BY 8pm.  Minor children may have two parents present. Special consideration for safety and communication needs will be reviewed on a case by case basis.  Special instructions:    Oral Hygiene is also important to reduce your risk of infection.  Remember - BRUSH YOUR TEETH THE MORNING OF SURGERY WITH YOUR REGULAR TOOTHPASTE   [place]- Preparing For Surgery  Before surgery, you can play an important role. Because skin is not sterile, your skin needs to be as free of germs as possible. You can reduce  the number of germs on your skin by washing with CHG (chlorahexidine gluconate) Soap before surgery.  CHG is an antiseptic cleaner which kills germs and bonds with the skin to continue killing germs even after washing.     Please do not use if you have an allergy to CHG or antibacterial soaps. If your skin becomes reddened/irritated stop using the CHG.  Do not shave (including legs and underarms) for at least 48 hours prior to first CHG shower. It is OK to shave your face.  Please follow these instructions carefully.     Shower the NIGHT BEFORE SURGERY and the MORNING OF SURGERY with CHG Soap.   If you chose to wash your hair, wash your hair first as usual with your normal shampoo. After you shampoo, rinse your hair and body thoroughly to remove the shampoo.  Then [employer] and genitals (  private parts) with your normal soap and rinse thoroughly to remove soap.  After that Use CHG Soap as you would any other liquid soap. You can apply CHG directly to the skin and wash gently with a scrungie or a clean washcloth.   Apply the CHG Soap to your body ONLY FROM THE NECK DOWN.  Do not use on open wounds or open sores. Avoid contact with your eyes, ears, mouth and genitals (private parts). Wash Face and genitals (private parts)  with your normal soap.   Wash thoroughly, paying special attention to the area where your surgery will be performed.  Thoroughly rinse your body with warm water from the neck down.  DO NOT shower/wash with your normal soap after using and rinsing off the CHG Soap.  Pat yourself dry with a CLEAN TOWEL.  Wear CLEAN PAJAMAS to bed the night before surgery  Place CLEAN SHEETS on your bed the night before your surgery  DO NOT SLEEP WITH PETS.   Day of Surgery: Take a shower with CHG soap. Wear Clean/Comfortable clothing the morning of surgery Do not apply any deodorants/lotions.   Remember to brush your teeth WITH YOUR REGULAR TOOTHPASTE.    COVID testing  If you  are going to stay overnight or be admitted after your procedure/surgery and require a pre-op COVID test, please follow these instructions after your COVID test   You are not required to quarantine however you are required to wear a well-fitting mask when you are out and around people not in your household.  If your mask becomes wet or soiled, replace with a new one.  Wash your hands often with soap and water for 20 seconds or clean your hands with an alcohol-based hand sanitizer that contains at least 60% alcohol.  Do not share personal items.  Notify your provider: if you are in close contact with someone who has COVID  or if you develop a fever of 100.4 or greater, sneezing, cough, sore throat, shortness of breath or body aches.    Please read over the following fact sheets that you were given.

## 2021-04-19 NOTE — Anesthesia Preprocedure Evaluation (Addendum)
Anesthesia Evaluation  Patient identified by MRN, date of birth, ID band Patient awake    Reviewed: Allergy & Precautions, NPO status , Patient's Chart, lab work & pertinent test results  History of Anesthesia Complications (+) Family history of anesthesia reaction  Airway Mallampati: I  TM Distance: >3 FB Neck ROM: Full    Dental no notable dental hx. (+) Teeth Intact, Dental Advisory Given, Caps   Pulmonary neg pulmonary ROS, sleep apnea (no longer uses CPAP) ,    Pulmonary exam normal breath sounds clear to auscultation       Cardiovascular hypertension, Pt. on medications Normal cardiovascular exam+ Valvular Problems/Murmurs  Rhythm:Regular Rate:Normal     Neuro/Psych negative neurological ROS  negative psych ROS   GI/Hepatic Neg liver ROS, hiatal hernia,   Endo/Other  diabetes (glu 126), Oral Hypoglycemic Agents  Renal/GU negative Renal ROS  negative genitourinary   Musculoskeletal  (+) Arthritis ,   Abdominal   Peds negative pediatric ROS (+)  Hematology negative hematology ROS (+)   Anesthesia Other Findings   Reproductive/Obstetrics negative OB ROS                          Anesthesia Physical Anesthesia Plan  ASA: 3  Anesthesia Plan: General   Post-op Pain Management: Tylenol PO (pre-op) and Minimal or no pain anticipated   Induction:   PONV Risk Score and Plan: 3 and Ondansetron, Dexamethasone and Treatment may vary due to age or medical condition  Airway Management Planned: LMA  Additional Equipment: None  Intra-op Plan:   Post-operative Plan:   Informed Consent:   Plan Discussed with: Anesthesiologist and CRNA  Anesthesia Plan Comments: (See APP note by Joslyn Hy, FNP   Pt is 76 years old with hx HTN, heart murmur, DM, OSA  EKG 04/17/21: Sinus bradycardia with 1st degree A-V block. Low voltage QRS. Cannot rule out Anterior infarct , age undetermined. 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 Rondel Baton office.  : Echo 08/20/2020 (Dr. Rondel Baton office): 1.  Normal LV systolic function.  LVEF 55-60%.  Normal LV wall thickness.  Normal diastolic function. 2.  Normal RV function. 3.  Cardiac chamber imaging demonstrates mild left atrial enlargement 4.  Valvular and Doppler imaging demonstrates mild mitral regurgitation, and mild tricuspid regurgitation 5.  Normal estimated PA systolic pressure 6.  No pericardial effusion.  Nuclear stress test 08/28/2011 (Dr. Rondel Baton office):   -Low risk scan.)      Anesthesia Quick Evaluation

## 2021-04-19 NOTE — Progress Notes (Addendum)
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 Last office visit 05/31/20.    LABS: Labs reviewed: Acceptable for surgery. (all labs ordered are listed, but only abnormal results are displayed)  Labs Reviewed  GLUCOSE, CAPILLARY - Abnormal; Notable for the following components:      Result Value   Glucose-Capillary 126 (*)    All other components within normal limits  HEMOGLOBIN A1C - Abnormal; Notable for the following components:   Hgb A1c MFr Bld 5.7 (*)    All other components within normal limits  BASIC METABOLIC PANEL - Abnormal; Notable for the following components:   Glucose, Bld 129 (*)    BUN 34 (*)    GFR, Estimated 59 (*)    All other components within normal limits  CBC - Abnormal; Notable for the following components:   HCT 46.3 (*)    All other components within normal limits    EKG 04/17/21: Sinus bradycardia with 1st degree A-V block. Low voltage QRS. Cannot rule out Anterior infarct , age undetermined. 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 02/07/21 office.    CV: Echo 08/20/2020 (Dr. 08/22/2020 office): 1.  Normal LV systolic function.  LVEF 55-60%.  Normal LV wall thickness.  Normal diastolic function. 2.  Normal RV function. 3.  Cardiac chamber imaging demonstrates mild left atrial enlargement 4.  Valvular and Doppler imaging demonstrates mild mitral regurgitation, and mild tricuspid regurgitation 5.   Normal estimated PA systolic pressure 6.  No pericardial effusion.  Nuclear stress test 08/28/2011 (Dr. 08/30/2011 office):   -Low risk scan.  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   Procedure: TOTAL KNEE ARTHROPLASTY;  Surgeon: 06/08/2015, MD;  Location: [facility] OR;  Service: Orthopedics;  Laterality: Right;    MEDICATIONS:  Ascorbic Acid Buffered (BUFFERED VITAMIN C) 1000 MG CAPS   aspirin EC 81 MG tablet   atorvastatin (LIPITOR) 20 MG tablet   Cholecalciferol (VITAMIN D3 PO)   empagliflozin (JARDIANCE) 25 MG TABS tablet   Glucosamine HCl 1000 MG TABS   liothyronine (CYTOMEL) 5 MCG tablet  lisinopril (PRINIVIL,ZESTRIL) 20 MG tablet   loratadine (CLARITIN) 10 MG tablet   Magnesium 500 MG TABS   metFORMIN (GLUCOPHAGE) 500 MG tablet   Methylcobalamin 5000 MCG TBDP   Methylsulfonylmethane (MSM) 1000 MG TABS   Multiple Vitamin (MULTIVITAMIN WITH MINERALS) TABS tablet   Omega-3 Fatty Acids (FISH OIL PO)   predniSONE (DELTASONE) 10 MG tablet   predniSONE (DELTASONE) 50 MG tablet   Probiotic Product (PROBIOTIC PO)   Semaglutide (OZEMPIC, 0.25 OR 0.5 MG/DOSE, [place])   spironolactone (ALDACTONE) 25 MG tablet   ZINC GLUCONATE PO   No current facility-administered medications for this encounter.    If no changes, I anticipate pt can proceed with surgery as scheduled.   Rica Mast, PhD, FNP-BC [facility] Short Stay Surgical Center/Anesthesiology Phone: [phone] 04/19/2021  3:31 PM

## 2021-04-22 ENCOUNTER — Ambulatory Visit (HOSPITAL_COMMUNITY)
Admission: RE | Admit: 2021-04-22 | Discharge: 2021-04-22 | Disposition: A | Discharge status: Home / Self Care | Payer: Medicare Other | Attending: Orthopedic Surgery | Admitting: Orthopedic Surgery

## 2021-04-22 ENCOUNTER — Ambulatory Visit (HOSPITAL_COMMUNITY): Payer: Medicare Other

## 2021-04-22 ENCOUNTER — Encounter (HOSPITAL_COMMUNITY): Payer: Self-pay | Admitting: Orthopedic Surgery

## 2021-04-22 ENCOUNTER — Ambulatory Visit (HOSPITAL_COMMUNITY): Payer: Medicare Other | Admitting: Vascular Surgery

## 2021-04-22 ENCOUNTER — Ambulatory Visit (HOSPITAL_BASED_OUTPATIENT_CLINIC_OR_DEPARTMENT_OTHER): Payer: Medicare Other | Admitting: Certified Registered"

## 2021-04-22 ENCOUNTER — Encounter (HOSPITAL_COMMUNITY)
Admission: RE | Disposition: A | Discharge status: Home / Self Care | Payer: Self-pay | Source: Home / Self Care | Attending: Orthopedic Surgery

## 2021-04-22 ENCOUNTER — Other Ambulatory Visit: Payer: Self-pay

## 2021-04-22 DIAGNOSIS — G4730 Sleep apnea, unspecified: Secondary | ICD-10-CM

## 2021-04-22 DIAGNOSIS — M20021 Boutonniere deformity of right finger(s): Secondary | ICD-10-CM | POA: Diagnosis not present

## 2021-04-22 DIAGNOSIS — M19041 Primary osteoarthritis, right hand: Secondary | ICD-10-CM | POA: Insufficient documentation

## 2021-04-22 DIAGNOSIS — M152 Bouchard's nodes (with arthropathy): Secondary | ICD-10-CM

## 2021-04-22 DIAGNOSIS — I10 Essential (primary) hypertension: Secondary | ICD-10-CM

## 2021-04-22 DIAGNOSIS — X58XXXA Exposure to other specified factors, initial encounter: Secondary | ICD-10-CM | POA: Diagnosis not present

## 2021-04-22 DIAGNOSIS — S66316A Strain of extensor muscle, fascia and tendon of right little finger at wrist and hand level, initial encounter: Secondary | ICD-10-CM | POA: Insufficient documentation

## 2021-04-22 DIAGNOSIS — R001 Bradycardia, unspecified: Secondary | ICD-10-CM | POA: Insufficient documentation

## 2021-04-22 DIAGNOSIS — G4733 Obstructive sleep apnea (adult) (pediatric): Secondary | ICD-10-CM | POA: Insufficient documentation

## 2021-04-22 DIAGNOSIS — E119 Type 2 diabetes mellitus without complications: Secondary | ICD-10-CM | POA: Diagnosis not present

## 2021-04-22 DIAGNOSIS — Z7984 Long term (current) use of oral hypoglycemic drugs: Secondary | ICD-10-CM | POA: Diagnosis not present

## 2021-04-22 DIAGNOSIS — K449 Diaphragmatic hernia without obstruction or gangrene: Secondary | ICD-10-CM | POA: Diagnosis not present

## 2021-04-22 HISTORY — PX: FINGER ARTHRODESIS: SHX5000

## 2021-04-22 LAB — GLUCOSE, CAPILLARY
Glucose-Capillary: 127 mg/dL — ABNORMAL HIGH (ref 70–99)
Glucose-Capillary: 143 mg/dL — ABNORMAL HIGH (ref 70–99)

## 2021-04-22 SURGERY — FUSION, JOINT, FINGER
Anesthesia: General | Site: Finger | Laterality: Right

## 2021-04-22 MED ORDER — LIDOCAINE 2% (20 MG/ML) 5 ML SYRINGE
INTRAMUSCULAR | Status: DC | PRN
Start: 2021-04-22 — End: 2021-04-22
  Administered 2021-04-22: 100 mg via INTRAVENOUS

## 2021-04-22 MED ORDER — PROPOFOL 10 MG/ML IV BOLUS
INTRAVENOUS | Status: AC
  Filled 2021-04-22: qty 20

## 2021-04-22 MED ORDER — OXYCODONE HCL 5 MG PO TABS: 5.0000 mg | ORAL_TABLET | Freq: Once | ORAL | Status: DC | PRN

## 2021-04-22 MED ORDER — ACETAMINOPHEN 500 MG PO TABS
1000.0000 mg | ORAL_TABLET | Freq: Once | ORAL | Status: DC
  Filled 2021-04-22: qty 2

## 2021-04-22 MED ORDER — ACETAMINOPHEN 325 MG PO TABS: 325.0000 mg | ORAL_TABLET | ORAL | Status: DC | PRN

## 2021-04-22 MED ORDER — FENTANYL CITRATE (PF) 100 MCG/2ML IJ SOLN: 25.0000 ug | INTRAMUSCULAR | Status: DC | PRN

## 2021-04-22 MED ORDER — ACETAMINOPHEN 10 MG/ML IV SOLN
INTRAVENOUS | Status: AC
  Filled 2021-04-22: qty 100

## 2021-04-22 MED ORDER — ACETAMINOPHEN 160 MG/5ML PO SOLN: 325.0000 mg | ORAL | Status: DC | PRN

## 2021-04-22 MED ORDER — ONDANSETRON HCL 4 MG/2ML IJ SOLN: 4.0000 mg | Freq: Once | INTRAMUSCULAR | Status: DC | PRN

## 2021-04-22 MED ORDER — OXYCODONE HCL 5 MG PO TABS
5.0000 mg | ORAL_TABLET | ORAL | 0 refills | Status: AC | PRN
Start: 2021-04-22 — End: 2021-04-27

## 2021-04-22 MED ORDER — LACTATED RINGERS IV SOLN: INTRAVENOUS | Status: DC

## 2021-04-22 MED ORDER — MEPERIDINE HCL 25 MG/ML IJ SOLN: 6.2500 mg | INTRAMUSCULAR | Status: DC | PRN

## 2021-04-22 MED ORDER — OXYCODONE HCL 5 MG/5ML PO SOLN: 5.0000 mg | Freq: Once | ORAL | Status: DC | PRN

## 2021-04-22 MED ORDER — 0.9 % SODIUM CHLORIDE (POUR BTL) OPTIME
TOPICAL | Status: DC | PRN
  Administered 2021-04-22: 1000 mL

## 2021-04-22 MED ORDER — DEXAMETHASONE SODIUM PHOSPHATE 10 MG/ML IJ SOLN
INTRAMUSCULAR | Status: DC | PRN
Start: 2021-04-22 — End: 2021-04-22
  Administered 2021-04-22: 10 mg via INTRAVENOUS

## 2021-04-22 MED ORDER — FENTANYL CITRATE (PF) 250 MCG/5ML IJ SOLN
INTRAMUSCULAR | Status: AC
  Filled 2021-04-22: qty 5

## 2021-04-22 MED ORDER — BUPIVACAINE HCL (PF) 0.25 % IJ SOLN
INTRAMUSCULAR | Status: DC | PRN
Start: 2021-04-22 — End: 2021-04-22
  Administered 2021-04-22: 10 mL

## 2021-04-22 MED ORDER — INSULIN ASPART 100 UNIT/ML IJ SOLN: 0.0000 [IU] | INTRAMUSCULAR | Status: DC | PRN

## 2021-04-22 MED ORDER — CHLORHEXIDINE GLUCONATE 0.12 % MT SOLN
15.0000 mL | Freq: Once | OROMUCOSAL | Status: AC
  Administered 2021-04-22: 15 mL via OROMUCOSAL
  Filled 2021-04-22: qty 15

## 2021-04-22 MED ORDER — PROPOFOL 10 MG/ML IV BOLUS
INTRAVENOUS | Status: DC | PRN
  Administered 2021-04-22: 150 mg via INTRAVENOUS

## 2021-04-22 MED ORDER — ORAL CARE MOUTH RINSE: 15.0000 mL | Freq: Once | OROMUCOSAL | Status: AC

## 2021-04-22 MED ORDER — BACITRACIN ZINC 500 UNIT/GM EX OINT
TOPICAL_OINTMENT | CUTANEOUS | Status: AC
  Filled 2021-04-22: qty 28.35

## 2021-04-22 MED ORDER — ONDANSETRON HCL 4 MG/2ML IJ SOLN
INTRAMUSCULAR | Status: DC | PRN
  Administered 2021-04-22: 4 mg via INTRAVENOUS

## 2021-04-22 MED ORDER — FENTANYL CITRATE (PF) 100 MCG/2ML IJ SOLN
INTRAMUSCULAR | Status: DC | PRN
  Administered 2021-04-22 (×2): 100 ug via INTRAVENOUS
  Administered 2021-04-22 (×2): 50 ug via INTRAVENOUS

## 2021-04-22 MED ORDER — CEFAZOLIN SODIUM-DEXTROSE 2-4 GM/100ML-% IV SOLN
2.0000 g | INTRAVENOUS | Status: AC
  Administered 2021-04-22: 2 g via INTRAVENOUS
  Filled 2021-04-22: qty 100

## 2021-04-22 MED ORDER — BUPIVACAINE HCL (PF) 0.25 % IJ SOLN
INTRAMUSCULAR | Status: AC
  Filled 2021-04-22: qty 20

## 2021-04-22 SURGICAL SUPPLY — 64 items
BAG COUNTER SPONGE SURGICOUNT (BAG) ×1 IMPLANT
BLADE OSCILLATING/SAGITTAL (BLADE) ×1
BLADE SW THK.38XMED NAR THN (BLADE) IMPLANT
BNDG COHESIVE 1X5 TAN STRL LF (GAUZE/BANDAGES/DRESSINGS) IMPLANT
BNDG ELASTIC 2X5.8 VLCR STR LF (GAUZE/BANDAGES/DRESSINGS) ×1 IMPLANT
BNDG ELASTIC 3X5.8 VLCR STR LF (GAUZE/BANDAGES/DRESSINGS) IMPLANT
BNDG ELASTIC 4X5.8 VLCR STR LF (GAUZE/BANDAGES/DRESSINGS) ×1 IMPLANT
BNDG GAUZE DERMACEA FLUFF (GAUZE/BANDAGES/DRESSINGS) ×1
BNDG GAUZE DERMACEA FLUFF 4 (GAUZE/BANDAGES/DRESSINGS) IMPLANT
BNDG GAUZE ELAST 4 BULKY (GAUZE/BANDAGES/DRESSINGS) ×4 IMPLANT
CAP PIN ORTHO PINK (CAP) IMPLANT
CAP PIN PROTECTOR ORTHO WHT (CAP) IMPLANT
CORD BIPOLAR FORCEPS 12FT (ELECTRODE) ×2 IMPLANT
COVER SURGICAL LIGHT HANDLE (MISCELLANEOUS) ×2 IMPLANT
CUFF TOURN SGL QUICK 18X4 (TOURNIQUET CUFF) ×2 IMPLANT
CUFF TOURN SGL QUICK 24 (TOURNIQUET CUFF)
CUFF TRNQT CYL 24X4X16.5-23 (TOURNIQUET CUFF) IMPLANT
DRAPE INCISE IOBAN 66X45 STRL (DRAPES) ×1 IMPLANT
DRAPE OEC MINIVIEW 54X84 (DRAPES) IMPLANT
DRAPE SURG 17X23 STRL (DRAPES) ×2 IMPLANT
DRSG XEROFORM 1X8 (GAUZE/BANDAGES/DRESSINGS) ×1 IMPLANT
GAUZE SPONGE 2X2 8PLY STRL LF (GAUZE/BANDAGES/DRESSINGS) IMPLANT
GAUZE SPONGE 4X4 12PLY STRL (GAUZE/BANDAGES/DRESSINGS) IMPLANT
GAUZE XEROFORM 1X8 LF (GAUZE/BANDAGES/DRESSINGS) IMPLANT
GLOVE SURG ENC TEXT LTX SZ8 (GLOVE) ×1 IMPLANT
GLOVE SURG MICRO LTX SZ8 (GLOVE) ×1 IMPLANT
GOWN STRL REUS W/ TWL LRG LVL3 (GOWN DISPOSABLE) ×2 IMPLANT
GOWN STRL REUS W/ TWL XL LVL3 (GOWN DISPOSABLE) ×3 IMPLANT
GOWN STRL REUS W/TWL LRG LVL3 (GOWN DISPOSABLE) ×2
GOWN STRL REUS W/TWL XL LVL3 (GOWN DISPOSABLE) ×3
K-WIRE .035 (WIRE) ×6
K-WIRE .045X4 (WIRE) ×1 IMPLANT
K-WIRE SMTH SNGL TROCAR .028X4 (WIRE)
KIT BASIN OR (CUSTOM PROCEDURE TRAY) ×2 IMPLANT
KIT TURNOVER KIT B (KITS) ×2 IMPLANT
KWIRE .035 (WIRE) ×3 IMPLANT
KWIRE FIXATION L4 .035 (WIRE) IMPLANT
KWIRE SMTH SNGL TROCAR .028X4 (WIRE) IMPLANT
MANIFOLD NEPTUNE II (INSTRUMENTS) ×1 IMPLANT
NDL BEVEL SYR 25X1 (NEEDLE) IMPLANT
NDL HYPO 25GX1X1/2 BEV (NEEDLE) IMPLANT
NEEDLE BEVEL SYR 25X1 (NEEDLE) ×2 IMPLANT
NEEDLE HYPO 25GX1X1/2 BEV (NEEDLE) IMPLANT
NS IRRIG 1000ML POUR BTL (IV SOLUTION) ×2 IMPLANT
PACK ORTHO EXTREMITY (CUSTOM PROCEDURE TRAY) ×2 IMPLANT
PAD ARMBOARD 7.5X6 YLW CONV (MISCELLANEOUS) ×3 IMPLANT
PAD CAST 4YDX4 CTTN HI CHSV (CAST SUPPLIES) IMPLANT
PADDING CAST COTTON 4X4 STRL (CAST SUPPLIES) ×1
SOL PREP POV-IOD 4OZ 10% (MISCELLANEOUS) ×3 IMPLANT
SPLINT FIBERGLASS 4X30 (CAST SUPPLIES) ×1 IMPLANT
SPONGE GAUZE 2X2 STER 10/PKG (GAUZE/BANDAGES/DRESSINGS)
SUCTION FRAZIER HANDLE 10FR (MISCELLANEOUS)
SUCTION TUBE FRAZIER 10FR DISP (MISCELLANEOUS) IMPLANT
SUT MERSILENE 4 0 P 3 (SUTURE) IMPLANT
SUT PROLENE 4 0 PS 2 18 (SUTURE) IMPLANT
SUT STEEL 2 (SUTURE) ×1 IMPLANT
SUT VIC AB 2-0 CT1 27 (SUTURE)
SUT VIC AB 2-0 CT1 TAPERPNT 27 (SUTURE) IMPLANT
SYR CONTROL 10ML LL (SYRINGE) IMPLANT
TOWEL GREEN STERILE (TOWEL DISPOSABLE) ×2 IMPLANT
TOWEL GREEN STERILE FF (TOWEL DISPOSABLE) ×2 IMPLANT
TUBE CONNECTING 12X1/4 (SUCTIONS) IMPLANT
UNDERPAD 30X36 HEAVY ABSORB (UNDERPADS AND DIAPERS) ×2 IMPLANT
WATER STERILE IRR 1000ML POUR (IV SOLUTION) ×1 IMPLANT

## 2021-04-22 NOTE — Anesthesia Procedure Notes (Signed)
Procedure Name: LMA Insertion Date/Time: 04/22/2021 12:45 PM Performed by: Darryl Nestle, CRNA Pre-anesthesia Checklist: Patient identified, Emergency Drugs available, Suction available and Patient being monitored Patient Re-evaluated:Patient Re-evaluated prior to induction Oxygen Delivery Method: Circle system utilized Preoxygenation: Pre-oxygenation with 100% oxygen Induction Type: IV induction LMA: LMA inserted LMA Size: 3.0 Tube type: Oral Number of attempts: 1 Placement Confirmation: positive ETCO2 and breath sounds checked- equal and bilateral Tube secured with: Tape Dental Injury: Teeth and Oropharynx as per pre-operative assessment

## 2021-04-22 NOTE — Brief Op Note (Signed)
04/22/2021  2:43 PM  PATIENT:  Natalie Whitney  76 y.o. female  PRE-OPERATIVE DIAGNOSIS:  Right small finger boutonniere deformity  POST-OPERATIVE DIAGNOSIS:  Right small finger boutonniere deformity  PROCEDURE:  Procedure(s): Right small finger PIP ARTHRODESIS FINGER (Right)  SURGEON:  Surgeon(s) and Role:    * Marlyne Beards, MD - Primary  PHYSICIAN ASSISTANT:   ASSISTANTS: none   ANESTHESIA:   local and general  EBL:  5 cc   BLOOD ADMINISTERED:none  DRAINS: none   LOCAL MEDICATIONS USED:  BUPIVICAINE   SPECIMEN:  No Specimen  DISPOSITION OF SPECIMEN:  N/A  COUNTS:  YES  TOURNIQUET:   Total Tourniquet Time Documented: Upper Arm (Right) - 88 minutes Total: Upper Arm (Right) - 88 minutes   DICTATION: .Reubin Milan Dictation  PLAN OF CARE: Discharge to home after PACU  PATIENT DISPOSITION:  PACU - hemodynamically stable.   Delay start of Pharmacological VTE agent (>24hrs) due to surgical blood loss or risk of bleeding: not applicable

## 2021-04-22 NOTE — Interval H&P Note (Signed)
History and Physical Interval Note:  04/22/2021 11:58 AM  [name]  has presented today for surgery, with the diagnosis of Right small finger boutonniere deformity.  The various methods of treatment have been discussed with the patient and family. After consideration of risks, benefits and other options for treatment, the patient has consented to  Procedure(s): Right small finger PIP ARTHRODESIS FINGER (Right) as a surgical intervention.  The patient's history has been reviewed, patient examined, no change in status, stable for surgery.  I have reviewed the patient's chart and labs.  Questions were answered to the patient's satisfaction.     Natalie Whitney

## 2021-04-22 NOTE — Transfer of Care (Signed)
Immediate Anesthesia Transfer of Care Note  Patient: Natalie Whitney  Procedure(s) Performed: Right small finger PIP ARTHRODESIS FINGER (Right: Finger)  Patient Location: PACU  Anesthesia Type:General  Level of Consciousness: awake, alert  and oriented  Airway & Oxygen Therapy: Patient Spontanous Breathing  Post-op Assessment: Report given to RN and Post -op Vital signs reviewed and stable  Post vital signs: Reviewed and stable  Last Vitals:  Vitals Value Taken Time  BP 129/59 04/22/21 1442  Temp 36.5 C 04/22/21 1442  Pulse 65 04/22/21 1451  Resp 14 04/22/21 1451  SpO2 91 % 04/22/21 1451  Vitals shown include unvalidated device data.  Last Pain:  Vitals:   04/22/21 1442  TempSrc:   PainSc: 0-No pain      Patients Stated Pain Goal: 3 (04/22/21 1116)  Complications: No notable events documented.

## 2021-04-22 NOTE — Op Note (Signed)
Date of Surgery: 04/22/2021  INDICATIONS: Natalie Whitney is a 76 y.o.-year-old female with right traumatic central slip injury that failed nonoperative management.  She subsequently developed a flexion contracture with degenerative changes present at the PIPJ.  We discussed attempted central slip reconstruction versus arthrodesis.  Given the degenerative changes and prolonged therapy requirements with central slip reconstruction, the decision was made to proceed with arthrodesis.  Risks, benefits, and alternatives to surgery were again discussed with the patient wishing to proceed with surgery.  Informed consent was signed after our discussion.   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  PROCEDURE:  Right small finger PIPJ arthrodesis   SURGEON: Audria Nine, M.D.  ASSIST:   ANESTHESIA:  general  IV FLUIDS AND URINE: See anesthesia.  ESTIMATED BLOOD LOSS: 5 mL.  IMPLANTS: * No implants in log *  0.035 k-wires x 2, 24g wire x 1  DRAINS: None  COMPLICATIONS: see description of procedure.  DESCRIPTION OF PROCEDURE: The patient was met in the preoperative holding area where the surgical site was marked and the consent form was verified.  The patient was then taken to the operating room and transferred to the operating table.  All bony prominences were well padded.  A tourniquet was applied to the right upper arm.  The operative extremity was prepped and draped in the usual and sterile fashion.  A formal time-out was performed to confirm that this was the correct patient, surgery, side, and site.   Following timeout, the limb was gently exsanguinated and the tourniquet inflated to 250 mmHg.  A longitudinal incision was made directly over the PIP joint.  The skin and subcutaneous tissue was divided.  The extensor apparatus was identified and split longitudinally.  The PIP joint was identified.  The collateral  ligaments were sharply divided off of their attachment to the middle phalanx.  The cut at the head of the proximal phalanx was made using a small sawblade.  Care was taken to ensure that the cut was perpendicular to the long axis of the phalanx.  The cut was made in such a way to allow for approximately 45 to 55 degrees flexion at the fused joint.  A cut was made in the base of the middle phalanx parallel to the joint surface.  A 0.035 K wire was used to make a transverse path through the middle phalanx through which the 24-gauge wire was passed.  The joint was then coapted and two 0.035 K wires were advanced from the proximal phalanx into the middle phalanx.  The 24-gauge wire was then twisted around the K wires in a figure-of-eight fashion.  The ends were twisted together.  The joint was well coapted both clinically and on fluoroscopy.  The joint was in appropriate position of approximately 45 to 55 degrees.  The wire was then cut.  The K wires were bent to 90 degrees.  The K wires were cut and then turned 180 degrees.  The end of the wire was bent such that it would not bother the dorsal soft tissues.  The wound was then thoroughly irrigated.  Fluoroscopy in both AP and lateral views showed appropriate coaptation and wire position.  The wound was then closed using 4-0 Mersilene followed by 4-0 nylon sutures.  The tourniquet was let down hemostasis achieved with direct pressure.  The wound was dressed with Xeroform, bacitracin on it, folded Kerlix, cast padding, and a ulnar gutter splint.  The  patient was reversed anesthesia and transferred the postoperative bed.  All counts were correct in the procedure.  The patient was taken to PACU in stable condition.   POSTOPERATIVE PLAN: The patient be discharged home with appropriate pain medication and discharge instructions.  I will see her back in 10 to 14 days for her first postop visit.  We will start her on hand therapy to have a removable splint fashioned and  start gentle mobilization of the MP and DIP joints.  Audria Nine, MD 2:44 PM

## 2021-04-22 NOTE — Discharge Instructions (Addendum)
° ° °Milana Salay, M.D. °Hand Surgery ° °POST-OPERATIVE DISCHARGE INSTRUCTIONS ° ° °PRESCRIPTIONS: °You have been given a prescription to be taken as directed for post-operative pain control.  You may also take over the counter ibuprofen/aleve and tylenol for pain. Take this as directed on the packaging. Do not exceed 3000 mg tylenol/acetaminophen in 24 hours. ° °Ibuprofen 600-800 mg (3-4) tablets by mouth every 6 hours as needed for pain.  °OR °Aleve 2 tablets by mouth every 12 hours (twice daily) as needed for pain.  °AND/OR °Tylenol 1000 mg (2 tablets) every 8 hours as needed for pain. ° °Please use your pain medication carefully, as refills are limited and you may not be provided with one.  As stated above, please use over the counter pain medicine - it will also be helpful with decreasing your swelling.  ° ° °ANESTHESIA: °After your surgery, post-surgical discomfort or pain is likely. This discomfort can last several days to a few weeks. At certain times of the day your discomfort may be more intense.  ° °Did you receive a nerve block?  °A nerve block can provide pain relief for one hour to two days after your surgery. As long as the nerve block is working, you will experience little or no sensation in the area the surgeon operated on.  °As the nerve block wears off, you will begin to experience pain or discomfort. It is very important that you begin taking your prescribed pain medication before the nerve block fully wears off. Treating your pain at the first sign of the block wearing off will ensure your pain is better controlled and more tolerable when full-sensation returns. Do not wait until the pain is intolerable, as the medicine will be less effective. It is better to treat pain in advance than to try and catch up.  ° °General Anesthesia:  °If you did not receive a nerve block during your surgery, you will need to start taking your pain medication shortly after your surgery and should continue to do  so as prescribed by your surgeon.   ° ° °ICE AND ELEVATION: °Elevation, as much as possible for the next 48 hours, is critical for decreasing swelling as well as for pain relief. Elevation means when you are seated or lying down, you hand should be at or above your heart. When walking, the hand needs to be at or above the level of your elbow.  °If the bandage gets too tight, it may need to be loosened. Please contact our office and we will instruct you in how to do this.  ° ° °SURGICAL BANDAGES:  °Keep your dressing and/or splint clean and dry at all times.  Do not remove until you are seen again in the office.  If careful, you may place a plastic bag over your bandage and tape the end to shower, but be careful, do not get your bandages wet.  °  ° °HAND THERAPY:  °You may not need any. If you do, we will begin this at your follow up visit in the clinic.  ° ° °ACTIVITY AND WORK: °You are encouraged to move any fingers which are not in the bandage.  °Light use of the fingers is allowed to assist the other hand with daily hygiene and eating, but strong gripping or lifting is often uncomfortable and should be avoided.  °You might miss a variable period of time from work and hopefully this issue has been discussed prior to surgery. You may not do any   heavy work with your affected hand for about 2 weeks.  ° ° °[facility] °[address] °[place],  [place]  [address] °[phone]  °

## 2021-04-23 ENCOUNTER — Encounter (HOSPITAL_COMMUNITY): Payer: Self-pay | Admitting: Orthopedic Surgery

## 2021-04-23 NOTE — Anesthesia Postprocedure Evaluation (Signed)
Anesthesia Post Note  Patient: Natalie Whitney  Procedure(s) Performed: Right small finger PIP ARTHRODESIS FINGER (Right: Finger)     Patient location during evaluation: PACU Anesthesia Type: General Level of consciousness: awake and alert Pain management: pain level controlled Vital Signs Assessment: post-procedure vital signs reviewed and stable Respiratory status: spontaneous breathing, nonlabored ventilation, respiratory function stable and patient connected to nasal cannula oxygen Cardiovascular status: blood pressure returned to baseline and stable Postop Assessment: no apparent nausea or vomiting Anesthetic complications: no   No notable events documented.  Last Vitals:  Vitals:   04/22/21 1456 04/22/21 1512  BP: 128/64 123/82  Pulse: 61 67  Resp: 16 19  Temp:  36.7 C  SpO2: 96% 95%    Last Pain:  Vitals:   04/22/21 1512  TempSrc:   PainSc: 0-No pain                 Valetta Mulroy

## 2021-04-24 ENCOUNTER — Other Ambulatory Visit: Payer: Self-pay | Admitting: Orthopedic Surgery

## 2021-04-24 DIAGNOSIS — M20021 Boutonniere deformity of right finger(s): Secondary | ICD-10-CM

## 2021-05-02 ENCOUNTER — Ambulatory Visit (INDEPENDENT_AMBULATORY_CARE_PROVIDER_SITE_OTHER): Payer: Medicare Other | Admitting: Orthopedic Surgery

## 2021-05-02 ENCOUNTER — Ambulatory Visit: Payer: Self-pay

## 2021-05-02 ENCOUNTER — Encounter: Payer: Medicare Other | Admitting: Rehabilitative and Restorative Service Providers"

## 2021-05-02 ENCOUNTER — Other Ambulatory Visit: Payer: Self-pay

## 2021-05-02 DIAGNOSIS — M79644 Pain in right finger(s): Secondary | ICD-10-CM

## 2021-05-02 NOTE — Progress Notes (Signed)
° °  Post-Op Visit Note   Patient: Natalie Whitney           Date of Birth: [birth year]           MRN: [id number] Visit Date: 05/02/2021 PCP: Alinda Deem, MD   Assessment & Plan:  Chief Complaint:  Chief Complaint  Patient presents with   Other    Post op 04/22/21 right small finger PIP arthrodesis    Visit Diagnoses:  1. Finger pain, right     Plan: Patient is 10 days s/p right small finger PIP arthrodesis w/ tension band wire construction.  Her incision is clean, dry, and well approximated.  The nylon sutures were removed. X-rays today show some backing out of the k-wires.  The fusion site remains well coapted.  Discussed treating this w/ continued immobilization.  I will put her back into an ulnar gutter splint today.  Discussed that I would like to wait for the osteotomy to unite prior to removal of the k-wires.  If there is displacement of the coaptation site then will discuss revision with a dorsal plate construct.  I will see her back in another two weeks unless she has an issue with the splint or she feels that the small finger is no longer immobilized in which case I will see her sooner.   Follow-Up Instructions: No follow-ups on file.   Orders:  Orders Placed This Encounter  Procedures   XR Finger Little Right   No orders of the defined types were placed in this encounter.   Imaging: No results found.  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  Past Surgical History:  Procedure Laterality Date   APPENDECTOMY      BREAST SURGERY     breast biopsy- unsure of side   COLONOSCOPY     DILATION AND CURETTAGE OF UTERUS     EYE SURGERY  2020   bilateral cataract removal   FINGER ARTHRODESIS Right 04/22/2021   Procedure: Right small finger PIP ARTHRODESIS FINGER;  Surgeon: Marlyne Beards, MD;  Location: [facility] OR;  Service: Orthopedics;  Laterality: Right;   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   Procedure: TOTAL KNEE ARTHROPLASTY;  Surgeon: Nadara Mustard, MD;  Location: [facility] OR;  Service: Orthopedics;  Laterality: Right;   Social History   Occupational History   Not on file  Tobacco Use   Smoking status: Never   Smokeless tobacco: Never  Vaping Use   Vaping Use: Never used  Substance and Sexual Activity   Alcohol use: No   Drug use: No   Sexual activity: Not on file

## 2021-05-02 NOTE — Therapy (Incomplete)
OUTPATIENT OCCUPATIONAL THERAPY ORTHO EVALUATION  Patient Name: Natalie Whitney MRN: [id number] DOB:[birth year], 76 y.o., female Today's Date: 05/02/2021  PCP: Alinda Deem, MD REFERRING PROVIDER: Marlyne Beards, MD    Past Medical History:  Diagnosis Date   Arthritis    Diabetes mellitus without complication [facility])    Family history of adverse reaction to anesthesia    daughter has nausea   Heart murmur    History of hiatal hernia    Hypertension    Pneumonia    Sleep apnea    stopped cpap   Past Surgical History:  Procedure Laterality Date   APPENDECTOMY     BREAST SURGERY     breast biopsy- unsure of side   COLONOSCOPY     DILATION AND CURETTAGE OF UTERUS     EYE SURGERY  2020   bilateral cataract removal   FINGER ARTHRODESIS Right 04/22/2021   Procedure: Right small finger PIP ARTHRODESIS FINGER;  Surgeon: Marlyne Beards, MD;  Location: [facility] OR;  Service: Orthopedics;  Laterality: Right;   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   Procedure: TOTAL KNEE ARTHROPLASTY;  Surgeon: Nadara Mustard, MD;  Location: [facility] OR;  Service: Orthopedics;  Laterality: Right;   Patient Active Problem List   Diagnosis Date Noted   Central slip extensor tendon injury (boutonniere), right 12/20/2020   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  SUBJECTIVE: ~2 weeks ago, she had Rt SF arthrodesis.   SUBJECTIVE STATEMENT: ***      PERTINENT HISTORY:  She attempted orthotic conservative healing starting 12/27/20, which was ultimately not successful, caused skin irritation, DIP ext lag, etc., and so ultimately had arthrodesis in fnl position.   PAIN:  Are you having pain? {yes/no:20286} NPRS scale: ***/10 Pain location: *** Pain orientation: {Pain Orientation:25161}  PAIN TYPE: {type:313116} Pain  description: {PAIN DESCRIPTION:21022940}  Aggravating factors: *** Relieving factors: ***  PRECAUTIONS: {Therapy precautions:24002}  HAND DOMINANCE: {MISC; OT HAND DOMINANCE:551 [id number] 0227}  WEIGHT BEARING RESTRICTIONS {Yes ***/No:24003}  FALLS: Has patient fallen in last 6 months? {yes/no:20286}, Number of falls: ***  PLOF: {PLOF:24004}  PATIENT GOALS ***  OBJECTIVE:   DIAGNOSTIC FINDINGS: ***  COGNITION: Overall cognitive status: {cognition:24006}  ADLs: Overall ADLs: ***  FUNCTIONAL OUTCOME MEASURES: {rehab surveys:24030}   SENSATION: Light touch: {IMPAIRED:25374} Stereognosis: {IMPAIRED:25374} Hot/Cold: {IMPAIRED:25374} Proprioception: {IMPAIRED:25374} Semmes Weinstein Monofilament scale: {semmes weinstein monofilament scale:25375}  COORDINATION: Finger Nose Finger test: *** 9 Hole Peg test: Right: *** sec; Left: *** sec Box and Blocks: Comments: ***  PERCEPTION: {Perception:25564}  PRAXIS: {Praxis:25565}  EDEMA: ***  MUSCLE TONE: {UETONE:25567}   SKIN INTEGRITY: ***  PALPATION: ***  UE AROM/PROM:  A/PROM Right 05/02/2021 Left 05/02/2021  Shoulder flexion    Shoulder abduction    Shoulder adduction    Shoulder extension    Shoulder internal rotation    Shoulder external rotation    Elbow flexion    Elbow extension    Wrist flexion    Wrist extension    Wrist ulnar deviation    Wrist radial deviation    Wrist pronation    Wrist supination    (Blank rows = not tested)  HAND A/PROM:  A/PROM Right 05/02/2021 Left 05/02/2021  Thumb MCP (0-60)    Thumb IP (0-80)    Thumb Radial abd/add (0-55)    Thumb Palmar abd/add (0-45)  Thumb opposition to index    Index MCP (0-90)    Index PIP (0-100)    Index DIP (0-70)    Long MCP (0-90)    Long PIP (0-100)    Long DIP (0-70)    Ring MCP (0-90_    Ring PIP (0-100)    Ring DIP (0-70)    Little MCP (0-90)    Little PIP (0-100)    Little DIP (0-70)    (Blank rows = not tested)  UE  MMT:  MMT Right 05/02/2021 Left 05/02/2021  Shoulder flexion    Shoulder abduction    Shoulder adduction    Shoulder extension    Shoulder internal rotation    Shoulder external rotation    Middle trapezius    Lower trapezius    Elbow flexion    Elbow extension    Wrist flexion    Wrist extension    Wrist ulnar deviation    Wrist radial deviation    Wrist pronation    Wrist supination    (Blank rows = not tested)  HAND FUNCTION:  Grip strength: Right: *** lbs; Left: *** lbs Lateral pinch: Right: *** lbs, Left: *** lbs 3 point pinch: Right: *** lbs, Left: *** lbs Tip pinch: Right *** lbs, Left: *** lbs Comments: ***  TODAY'S TREATMENT:  05/02/21: OT fabricates custom protection orthotic (clamshell) for Rt SF PIP J arthrodesis protection while healing. Also applies light gauze and coban wrap for edema management. OT also edu for A/ROM at uninvolved joints (FA, wrist, MP Js), and she tolerates well with no increased pain. Asked to do for HEP 4-6 x day as tolerated, ice if needed for pain/swelling. ***   PATIENT EDUCATION: Education details: see tx section above for details  Person educated: Patient Education method: Explanation, Demonstration, and Handouts Education comprehension: verbalized understanding and needs further education   HOME EXERCISE PROGRAM: ***  ASSESSMENT:  CLINICAL IMPRESSION: Patient is a 76 y.o. female who was seen today for occupational therapy evaluation and treatment for SF arthrodesis and hand dysfunction.   PERFORMANCE DEFICITS in functional skills including {OT physical skills:25468}, cognitive skills including {OT cognitive skills:25469}, and psychosocial skills including {OT psychosocial skills:25470}.   These impairments are limiting patient from {OT performance deficits:25471}.   COMORBIDITIES {Comorbidities:25485} that affects occupational performance. Patient will benefit from skilled OT to address above impairments and improve overall  function.  MODIFICATION OR ASSISTANCE TO COMPLETE EVALUATION: {OT modification:25474}  OT OCCUPATIONAL PROFILE AND HISTORY: {OT PROFILE AND HISTORY:25484}  CLINICAL DECISION MAKING: {OT CDM:25475}  REHAB POTENTIAL: {rehabpotential:25112}  EVALUATION COMPLEXITY: {Evaluation complexity:25115}     GOALS: Goals reviewed with patient? {yes/no:20286}  SHORT TERM GOALS: (STG required if POC>30 days)  STG Name Target Date Goal status  1 *** Baseline:  {follow up:25551} {GOALSTATUS:25110}  2 *** Baseline:  {follow up:25551} {GOALSTATUS:25110}  3 *** Baseline:  {follow up:25551} {GOALSTATUS:25110}  4 *** Baseline:  {follow up:25551} {GOALSTATUS:25110}  5 *** Baseline:  {follow up:25551} {GOALSTATUS:25110}  6 *** Baseline:  {follow up:25551} {GOALSTATUS:25110}  7 *** Baseline:  {follow up:25551} {GOALSTATUS:25110}   LONG TERM GOALS:   LTG Name Target Date Goal status  1 *** Baseline:  {follow up:25551} {GOALSTATUS:25110}  2 *** Baseline:  {follow up:25551} {GOALSTATUS:25110}  3 *** Baseline:  {follow up:25551} {GOALSTATUS:25110}  4 *** Baseline:  {follow up:25551} {GOALSTATUS:25110}  5 *** Baseline:  {follow up:25551} {GOALSTATUS:25110}  6 *** Baseline: {follow up:25551} {GOALSTATUS:25110}  7 *** Baseline:  {follow up:25551} {GOALSTATUS:25110}   PLAN: OT FREQUENCY: {rehab  frequency:25116}  OT DURATION: {rehab duration:25117}  PLANNED INTERVENTIONS: {OT Interventions:25467}  PLAN FOR NEXT SESSION: ***  RECOMMENDED OTHER SERVICES: ***  CONSULTED AND AGREED WITH PLAN OF CARE: {GYJ:85631}   Raymond Azure, OTR/L, CHT 05/02/2021, 8:54 AM

## 2021-05-16 ENCOUNTER — Ambulatory Visit (INDEPENDENT_AMBULATORY_CARE_PROVIDER_SITE_OTHER): Payer: Medicare Other | Admitting: Orthopedic Surgery

## 2021-05-16 ENCOUNTER — Ambulatory Visit: Payer: Self-pay

## 2021-05-16 ENCOUNTER — Encounter: Payer: Self-pay | Admitting: Orthopedic Surgery

## 2021-05-16 ENCOUNTER — Other Ambulatory Visit: Payer: Self-pay

## 2021-05-16 DIAGNOSIS — M79644 Pain in right finger(s): Secondary | ICD-10-CM

## 2021-05-16 DIAGNOSIS — M20021 Boutonniere deformity of right finger(s): Secondary | ICD-10-CM

## 2021-05-16 NOTE — Progress Notes (Signed)
? ?  Post-Op Visit Note ?  ?Patient: Natalie Whitney           ?Date of Birth: 08/20/1945           ?MRN: [id number] ?Visit Date: 05/16/2021 ?PCP: Alinda Deem, MD ? ? ?Assessment & Plan: ? ?Chief Complaint:  ?Chief Complaint  ?Patient presents with  ? Right Little Finger - Post-op Follow-up  ? ?Visit Diagnoses:  ?1. Central slip extensor tendon injury (boutonniere), right   ?2. Finger pain, right   ? ? ?Plan: Patient is 3.5 weeks s/p right small finger PIP arthrodesis for failed nonoperative management of a central slip injury with fixed boutinierre deformity.  Repeat x-rays today show maintained coaptation of the arthrodesis.  The k-wires are unchanged in position from her previous x-ray.  She was splinted for two weeks after our first postop visit given the apparent backing out of the k-wires.  I was concerned about losing the coaptation at the arthrodesis site. Will put her back in a splint today and start her in therapy to start gentle ROM of the MP and DIP joints.  ? ?Follow-Up Instructions: No follow-ups on file.  ? ?Orders:  ?Orders Placed This Encounter  ?Procedures  ? XR Finger Little Right  ? ?No orders of the defined types were placed in this encounter. ? ? ?Imaging: ?No results found. ? ?PMFS History: ?Patient Active Problem List  ? Diagnosis Date Noted  ? Central slip extensor tendon injury (boutonniere), right 12/20/2020  ? 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 ?Past Medical History:  ?Diagnosis Date  ? Arthritis   ? Diabetes mellitus without complication (HCC)   ? Family history of adverse reaction to anesthesia   ? daughter has nausea  ? Heart murmur   ? History of hiatal hernia   ? Hypertension   ? Pneumonia   ? Sleep apnea   ? stopped cpap  ?  ?History reviewed. No pertinent family history.  ?Past Surgical History:  ?Procedure Laterality Date  ? APPENDECTOMY    ? BREAST SURGERY    ? breast biopsy- unsure of side  ? COLONOSCOPY    ? DILATION AND CURETTAGE OF UTERUS    ? EYE SURGERY  2020  ?  bilateral cataract removal  ? FINGER ARTHRODESIS Right 04/22/2021  ? Procedure: Right small finger PIP ARTHRODESIS FINGER;  Surgeon: Marlyne Beards, MD;  Location: [facility] OR;  Service: Orthopedics;  Laterality: Right;  ? 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  ? Procedure: TOTAL KNEE ARTHROPLASTY;  Surgeon: Nadara Mustard, MD;  Location: [facility] OR;  Service: Orthopedics;  Laterality: Right;  ? ?Social History  ? ?Occupational History  ? Not on file  ?Tobacco Use  ? Smoking status: Never  ? Smokeless tobacco: Never  ?Vaping Use  ? Vaping Use: Never used  ?Substance and Sexual Activity  ? Alcohol use: No  ? Drug use: No  ? Sexual activity: Not on file  ? ? ? ?

## 2021-05-28 ENCOUNTER — Ambulatory Visit: Payer: PRIVATE HEALTH INSURANCE | Admitting: Orthopedic Surgery

## 2021-05-29 NOTE — Addendum Note (Signed)
Addended by: Marlyne Beards on: 05/29/2021 12:52 PM ? ? Modules accepted: Orders ? ?

## 2021-05-30 ENCOUNTER — Other Ambulatory Visit: Payer: Self-pay

## 2021-05-30 ENCOUNTER — Ambulatory Visit: Payer: Medicare Other

## 2021-05-30 ENCOUNTER — Encounter: Payer: Self-pay | Admitting: Orthopedic Surgery

## 2021-05-30 ENCOUNTER — Encounter: Payer: Self-pay | Admitting: Rehabilitative and Restorative Service Providers"

## 2021-05-30 ENCOUNTER — Ambulatory Visit (INDEPENDENT_AMBULATORY_CARE_PROVIDER_SITE_OTHER): Payer: Medicare Other | Admitting: Orthopedic Surgery

## 2021-05-30 ENCOUNTER — Ambulatory Visit: Payer: Medicare Other | Admitting: Rehabilitative and Restorative Service Providers"

## 2021-05-30 DIAGNOSIS — R278 Other lack of coordination: Secondary | ICD-10-CM | POA: Diagnosis not present

## 2021-05-30 DIAGNOSIS — M25641 Stiffness of right hand, not elsewhere classified: Secondary | ICD-10-CM

## 2021-05-30 DIAGNOSIS — M20021 Boutonniere deformity of right finger(s): Secondary | ICD-10-CM

## 2021-05-30 DIAGNOSIS — R600 Localized edema: Secondary | ICD-10-CM | POA: Diagnosis not present

## 2021-05-30 DIAGNOSIS — M25512 Pain in left shoulder: Secondary | ICD-10-CM

## 2021-05-30 DIAGNOSIS — M25631 Stiffness of right wrist, not elsewhere classified: Secondary | ICD-10-CM

## 2021-05-30 DIAGNOSIS — M79644 Pain in right finger(s): Secondary | ICD-10-CM | POA: Diagnosis not present

## 2021-05-30 DIAGNOSIS — M6281 Muscle weakness (generalized): Secondary | ICD-10-CM

## 2021-05-30 NOTE — Therapy (Addendum)
?OUTPATIENT OCCUPATIONAL THERAPY ORTHO EVALUATION & DISCHARGE NOTE ? ?Patient Name: Natalie Whitney ?MRN: [id number] ?DOB:[birth year], 76 y.o., female ?Today's Date: 05/30/2021 ? ?PCP: Earney Mallet, MD ?REFERRING PROVIDER: Sherilyn Cooter, MD ? ? OT End of Session - 05/30/21 1612   ? ? Visit Number 1   ? Number of Visits 6   ? Date for OT Re-Evaluation 06/28/21   ? OT Start Time 1791   ? OT Stop Time 1646   ? OT Time Calculation (min) 32 min   ? Equipment Utilized During Treatment orthotic material   ? Activity Tolerance Patient tolerated treatment well;No increased pain;Patient limited by pain   ? Behavior During Therapy [facility] for tasks assessed/performed   ? ?  ?  ? ?  ? ? ?Past Medical History:  ?Diagnosis Date  ? Arthritis   ? Diabetes mellitus without complication ([place])   ? Family history of adverse reaction to anesthesia   ? daughter has nausea  ? Heart murmur   ? History of hiatal hernia   ? Hypertension   ? Pneumonia   ? Sleep apnea   ? stopped cpap  ? ?Past Surgical History:  ?Procedure Laterality Date  ? APPENDECTOMY    ? BREAST SURGERY    ? breast biopsy- unsure of side  ? COLONOSCOPY    ? DILATION AND CURETTAGE OF UTERUS    ? EYE SURGERY  2020  ? bilateral cataract removal  ? FINGER ARTHRODESIS Right 04/22/2021  ? Procedure: Right small finger PIP ARTHRODESIS FINGER;  Surgeon: Sherilyn Cooter, MD;  Location: [facility];  Service: Orthopedics;  Laterality: Right;  ? 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  ? Procedure: TOTAL KNEE ARTHROPLASTY;  Surgeon: Newt Minion, MD;  Location: [place];  Service: Orthopedics;  Laterality: Right;  ? ?Patient Active Problem List  ? Diagnosis Date Noted  ? Central slip extensor tendon injury (boutonniere), right 12/20/2020  ? 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 ? ?ONSET DATE: 04/22/21 DOS ? ?REFERRING DIAG: M20.021 (ICD-10-CM) - Central slip extensor tendon injury (boutonniere), right ? ?THERAPY DIAG:  ?Other lack of coordination ? ?Pain in right  finger(s) ? ?Localized edema ? ?Stiffness of right hand, not elsewhere classified ? ?Muscle weakness (generalized) ? ?Acute pain of left shoulder ? ?Stiffness of right wrist, not elsewhere classified ? ?SUBJECTIVE:  ? ?SUBJECTIVE STATEMENT: ?She states finger is not painful except where bulky cast was rubbing. She states left shoulder has begun to hurt in last 2 weeks, likely due to overuse and avoidance of right hand/arm when pushing to stand up.  ? ?Pt accompanied by:  sister ? ?PERTINENT HISTORY: 5 weeks post op arthrodesis now; plan is to have her pins removed (as they seem to be backing out) next week; she previously failed conservative tx, past issues with skin breakdown, etc.  ? ?PRECAUTIONS: none ? ?WEIGHT BEARING RESTRICTIONS Yes NWB to Rt SF for now ? ?PAIN:  ?Are you having pain? Yes; left shoulder with reaching up, also irritated right SF PIP dorsum where it was rubbing bulky cast.  Rated 2/10 at rest and up to 6/10 with reaching up in shoulder ? ?FALLS: Has patient fallen in last 6 months? Yes, Number of falls: 1 (this accident back in October, per pt reports.  ? ?PLOF: Independent with basic ADLs, Independent with gait, Independent with transfers, and Sister drives her and helps her with difficult or higher level IADLs  ? ?PATIENT GOALS To have better use of right hand and no pain in  left shoulder  ? ?OBJECTIVE:  ? ?HAND DOMINANCE: Right ? ?ADLs: ?Overall ADLs: difficulty with opening containers, left shoulder hurts when "lifting my tea cup," difficulty reaching to bathe, clumsy with right hand ? ?FUNCTIONAL OUTCOME MEASURES: ?Quick Dash: TBD ? ?UE ROM    ? ?Active ROM Right ?05/30/2021 Left ?05/30/2021  ?Shoulder flexion  ~85* and painful (PROM goes to ~135* no pain)  ?Shoulder abduction    ?Shoulder adduction    ?Shoulder extension    ?Shoulder internal rotation  ~40* PROM  ?Shoulder external rotation  ~90* PROM  ?Elbow flexion    ?Elbow extension    ?Wrist flexion 40 62  ?Wrist extension 37 50  ?Wrist  ulnar deviation    ?Wrist radial deviation    ?Wrist pronation 85   ?Wrist supination 85   ?(Blank rows = not tested) ? ?Active ROM Right ?05/30/2021  ?Little MCP (0-90)  0-38*  ?Little PIP (0-100)  fused @ 50*   ?Little DIP (0-70)  10* hyper ext, no motion today  ?(Blank rows = not tested) ? ? ?UE MMT:   Rt hand NT due to fusion, etc.  ? ?MMT Left ?05/30/2021  ?Shoulder flexion Painful 3-/5 MMT  ?Shoulder abduction 3-/5 MMT pain  ?Shoulder adduction   ?Shoulder extension   ?Shoulder internal rotation   ?Shoulder external rotation   ?(Blank rows = not tested) ? ?HAND FUNCTION: ?Grip strength: Right: TBD lbs; Left: TBD lbs ? ?COORDINATION: ?Overtly decreased right hand  ? ?SENSATION: ?No c/o today ? ?EDEMA: swelling about rt SF PIP J, some redness and small 70m x 381mabrasion dorsally ? ?COGNITION: ?Overall cognitive status: Within functional limits for tasks assessed ? ?OBSERVATIONS: Rt SF DIP J is somewhat contracted in hyperextension, resembling Boutonniere Deformity, no active motion at DIP today but flexible passively ~10-15* in total. Rt wrist somewhat tight as well.  ? ?Lt shoulder: positive Neer's Impingement, Negative Drop-Arm Test, positive Jobe's Test, also possible biceps tendon involvement.  ? ? ?TODAY'S TREATMENT:  ?05/30/21 Eval: OT fabricates custom rt SF PIP J orthosis to statically protect PIP J until sx to remove pins.  Once sx done, it can be reapplied like clamshell with coban wrap to hold in place.  OT will likely need to fabricate a more appropriate, non-bulky orthotic after sx as well.  It may also be appropriate to perform static serial casting to DIP J to stop hyperextension contracture.  Additionally, OT quickly edu on HEP for 3x day Rt wrist flex, ext stretches as well as b/l sh retraction isometrics to help apparent shoulder impingement in lt shoulder. She demo's back, states understanding.  ? ? ?PATIENT EDUCATION: ?Education details: see tx section above for details  ?Person educated:  Patient and sister ?Education method: Explanation and Demonstration ?Education comprehension: verbalized understanding, returned demonstration, and needs further education ? ? ?HOME EXERCISE PROGRAM: ?See above tx section for details  ? ?GOALS: ?Goals reviewed with patient? Yes ? ?SHORT TERM GOALS: (STG required if POC>30 days) ? ?Pt will obtain protective, custom orthotic. ?Target date: 05/30/21 ?Goal status: MET- however will likely need updated orthosis after sx as well  ? ?2.  Pt will demo/state understanding of initial HEP to improve pain levels and prerequisite motion. ?Target date: 06/13/21 ?Goal status: INITIAL ? ? ?LONG TERM GOALS: ? ?Pt will improve functional ability by decreased impairment per Quick DASH assessment from TBD% to 15% or better, for better quality of life. ?Target date: 06/28/21 ?Goal status: INITIAL ? ?2.  Pt will improve  grip strength in right hand to at least 25 lbs for functional use at home and in IADLs. ?Target date: 06/28/21 ?Goal status: INITIAL ? ?3.  Pt will improve A/ROM in rt wrist flex/ext from ~40* each to at least 55* each, to have functional motion for tasks like reach and grasp.  ?Target date: 06/28/21 ?Goal status: INITIAL ? ?4.  Pt will improve strength in left shoulder flexion from painful 3-/5 MMT to at least 4/5 MMT to have increased functional ability to carry out selfcare and higher-level homecare tasks with no difficulty. ?Target date: 06/28/21 ?Goal status: INITIAL ? ? ? ?ASSESSMENT: ? ?CLINICAL IMPRESSION: ?Patient is a 76 y.o. female who was seen today for occupational therapy evaluation for decreased functional ability and pain due to right SF fusion and healing issues, stiffness in wrist/hand and painful left shoulder.  ? ?PERFORMANCE DEFICITS in functional skills including ADLs, IADLs, coordination, dexterity, edema, ROM, strength, pain, fascial restrictions, flexibility, FMC, GMC, mobility, balance, body mechanics, endurance, decreased knowledge of precautions, skin  integrity, and UE functional use, cognitive skills including problem solving, and psychosocial skills including coping strategies, environmental adaptation, habits, and routines and behaviors.  ? ?IMPAIRMENTS are limiting pa

## 2021-05-30 NOTE — H&P (View-Only) (Signed)
? ?  Post-Op Visit Note ?  ?Patient: Natalie Whitney           ?Date of Birth: 08/02/1945           ?MRN: [id number] ?Visit Date: 05/30/2021 ?PCP: Jannach, Stephen, MD ? ? ?Assessment & Plan: ? ?Chief Complaint:  ?Chief Complaint  ?Patient presents with  ? Right Little Finger - Post-op Follow-up  ? ?Visit Diagnoses:  ?1. Central slip extensor tendon injury (boutonniere), right   ? ? ?Plan: Patient is now 5.5 weeks s/p right small finger PIP arthrodesis for central slip injury that failed conservative management in the setting of an arthritic PIP joint.  She has no pain at the PIP joint.  Previous x-rays show some migration of one of the k-wires.  She now has a slightly red spot in the area of the offending k-wire. We will plan on revising one of the k-wires under local anesthesia to allow the fusion to heal and prevent further insult to her skin and soft tissue.  Repeat x-rays today show no change in the appearance of the arthrodesis site. A PIP splint has been made by hand therapy for her to wear.  ? ?Follow-Up Instructions: No follow-ups on file.  ? ?Orders:  ?Orders Placed This Encounter  ?Procedures  ? XR Finger Little Right  ? ?No orders of the defined types were placed in this encounter. ? ? ?Imaging: ?No results found. ? ?PMFS History: ?Patient Active Problem List  ? Diagnosis Date Noted  ? Central slip extensor tendon injury (boutonniere), right 12/20/2020  ? 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 ?Past Medical History:  ?Diagnosis Date  ? Arthritis   ? Diabetes mellitus without complication ([facility])   ? Family history of adverse reaction to anesthesia   ? daughter has nausea  ? Heart murmur   ? History of hiatal hernia   ? Hypertension   ? Pneumonia   ? Sleep apnea   ? stopped cpap  ?  ?History reviewed. No pertinent family history.  ?Past Surgical History:  ?Procedure Laterality Date  ? APPENDECTOMY    ? BREAST SURGERY    ? breast biopsy- unsure of side  ? COLONOSCOPY    ? DILATION AND CURETTAGE OF UTERUS     ? EYE SURGERY  2020  ? bilateral cataract removal  ? FINGER ARTHRODESIS Right 04/22/2021  ? Procedure: Right small finger PIP ARTHRODESIS FINGER;  Surgeon: Martin Smeal, Randee Huston, MD;  Location: [facility];  Service: Orthopedics;  Laterality: Right;  ? 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  ? Procedure: TOTAL KNEE ARTHROPLASTY;  Surgeon: Marcus Duda V, MD;  Location: [facility];  Service: Orthopedics;  Laterality: Right;  ? ?Social History  ? ?Occupational History  ? Not on file  ?Tobacco Use  ? Smoking status: Never  ? Smokeless tobacco: Never  ?Vaping Use  ? Vaping Use: Never used  ?Substance and Sexual Activity  ? Alcohol use: No  ? Drug use: No  ? Sexual activity: Not on file  ? ? ? ?

## 2021-05-30 NOTE — Progress Notes (Addendum)
? ?  Post-Op Visit Note ?  ?Patient: Natalie Whitney           ?Date of Birth: Nov 13, 1945           ?MRN: [id number] ?Visit Date: 05/30/2021 ?PCP: Alinda Deem, MD ? ? ?Assessment & Plan: ? ?Chief Complaint:  ?Chief Complaint  ?Patient presents with  ? Right Little Finger - Post-op Follow-up  ? ?Visit Diagnoses:  ?1. Central slip extensor tendon injury (boutonniere), right   ? ? ?Plan: Patient is now 5.5 weeks s/p right small finger PIP arthrodesis for central slip injury that failed conservative management in the setting of an arthritic PIP joint.  She has no pain at the PIP joint.  Previous x-rays show some migration of one of the k-wires.  She now has a slightly red spot in the area of the offending k-wire. We will plan on revising one of the k-wires under local anesthesia to allow the fusion to heal and prevent further insult to her skin and soft tissue.  Repeat x-rays today show no change in the appearance of the arthrodesis site. A PIP splint has been made by hand therapy for her to wear.  ? ?Follow-Up Instructions: No follow-ups on file.  ? ?Orders:  ?Orders Placed This Encounter  ?Procedures  ? XR Finger Little Right  ? ?No orders of the defined types were placed in this encounter. ? ? ?Imaging: ?No results found. ? ?PMFS History: ?Patient Active Problem List  ? Diagnosis Date Noted  ? Central slip extensor tendon injury (boutonniere), right 12/20/2020  ? 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 ?Past Medical History:  ?Diagnosis Date  ? Arthritis   ? Diabetes mellitus without complication ([facility])   ? Family history of adverse reaction to anesthesia   ? daughter has nausea  ? Heart murmur   ? History of hiatal hernia   ? Hypertension   ? Pneumonia   ? Sleep apnea   ? stopped cpap  ?  ?History reviewed. No pertinent family history.  ?Past Surgical History:  ?Procedure Laterality Date  ? APPENDECTOMY    ? BREAST SURGERY    ? breast biopsy- unsure of side  ? COLONOSCOPY    ? DILATION AND CURETTAGE OF UTERUS     ? EYE SURGERY  2020  ? bilateral cataract removal  ? FINGER ARTHRODESIS Right 04/22/2021  ? Procedure: Right small finger PIP ARTHRODESIS FINGER;  Surgeon: Marlyne Beards, MD;  Location: [facility];  Service: Orthopedics;  Laterality: Right;  ? 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  ? Procedure: TOTAL KNEE ARTHROPLASTY;  Surgeon: Nadara Mustard, MD;  Location: [facility];  Service: Orthopedics;  Laterality: Right;  ? ?Social History  ? ?Occupational History  ? Not on file  ?Tobacco Use  ? Smoking status: Never  ? Smokeless tobacco: Never  ?Vaping Use  ? Vaping Use: Never used  ?Substance and Sexual Activity  ? Alcohol use: No  ? Drug use: No  ? Sexual activity: Not on file  ? ? ? ?

## 2021-06-03 ENCOUNTER — Encounter: Payer: PRIVATE HEALTH INSURANCE | Admitting: Rehabilitative and Restorative Service Providers"

## 2021-06-03 ENCOUNTER — Other Ambulatory Visit: Payer: Self-pay

## 2021-06-03 ENCOUNTER — Encounter (HOSPITAL_BASED_OUTPATIENT_CLINIC_OR_DEPARTMENT_OTHER): Payer: Self-pay | Admitting: Orthopedic Surgery

## 2021-06-05 ENCOUNTER — Encounter (HOSPITAL_BASED_OUTPATIENT_CLINIC_OR_DEPARTMENT_OTHER): Payer: Self-pay | Admitting: Orthopedic Surgery

## 2021-06-05 ENCOUNTER — Encounter (HOSPITAL_BASED_OUTPATIENT_CLINIC_OR_DEPARTMENT_OTHER)
Admission: RE | Disposition: A | Discharge status: Home / Self Care | Payer: Self-pay | Source: Home / Self Care | Attending: Orthopedic Surgery

## 2021-06-05 ENCOUNTER — Ambulatory Visit (HOSPITAL_BASED_OUTPATIENT_CLINIC_OR_DEPARTMENT_OTHER): Payer: Medicare Other

## 2021-06-05 ENCOUNTER — Ambulatory Visit (HOSPITAL_BASED_OUTPATIENT_CLINIC_OR_DEPARTMENT_OTHER): Payer: Medicare Other | Admitting: Anesthesiology

## 2021-06-05 ENCOUNTER — Other Ambulatory Visit: Payer: Self-pay

## 2021-06-05 ENCOUNTER — Ambulatory Visit (HOSPITAL_BASED_OUTPATIENT_CLINIC_OR_DEPARTMENT_OTHER)
Admission: RE | Admit: 2021-06-05 | Discharge: 2021-06-05 | Disposition: A | Discharge status: Home / Self Care | Payer: Medicare Other | Attending: Orthopedic Surgery | Admitting: Orthopedic Surgery

## 2021-06-05 DIAGNOSIS — M19041 Primary osteoarthritis, right hand: Secondary | ICD-10-CM

## 2021-06-05 DIAGNOSIS — E119 Type 2 diabetes mellitus without complications: Secondary | ICD-10-CM

## 2021-06-05 DIAGNOSIS — Z969 Presence of functional implant, unspecified: Secondary | ICD-10-CM

## 2021-06-05 DIAGNOSIS — I10 Essential (primary) hypertension: Secondary | ICD-10-CM | POA: Diagnosis not present

## 2021-06-05 DIAGNOSIS — T84028A Dislocation of other internal joint prosthesis, initial encounter: Secondary | ICD-10-CM | POA: Diagnosis not present

## 2021-06-05 DIAGNOSIS — Z7984 Long term (current) use of oral hypoglycemic drugs: Secondary | ICD-10-CM

## 2021-06-05 DIAGNOSIS — X58XXXA Exposure to other specified factors, initial encounter: Secondary | ICD-10-CM | POA: Insufficient documentation

## 2021-06-05 DIAGNOSIS — Z981 Arthrodesis status: Secondary | ICD-10-CM | POA: Diagnosis not present

## 2021-06-05 DIAGNOSIS — T8484XA Pain due to internal orthopedic prosthetic devices, implants and grafts, initial encounter: Secondary | ICD-10-CM | POA: Diagnosis not present

## 2021-06-05 DIAGNOSIS — Z79899 Other long term (current) drug therapy: Secondary | ICD-10-CM

## 2021-06-05 DIAGNOSIS — G4730 Sleep apnea, unspecified: Secondary | ICD-10-CM | POA: Diagnosis not present

## 2021-06-05 HISTORY — PX: PROXIMAL INTERPHALANGEAL FUSION (PIP): SHX6043

## 2021-06-05 LAB — GLUCOSE, CAPILLARY
Glucose-Capillary: 148 mg/dL — ABNORMAL HIGH (ref 70–99)
Glucose-Capillary: 172 mg/dL — ABNORMAL HIGH (ref 70–99)

## 2021-06-05 SURGERY — FUSION, PIP JOINT
Anesthesia: General | Laterality: Right

## 2021-06-05 MED ORDER — BUPIVACAINE HCL (PF) 0.5 % IJ SOLN: INTRAMUSCULAR | Status: DC | PRN

## 2021-06-05 MED ORDER — FENTANYL CITRATE (PF) 100 MCG/2ML IJ SOLN: 25.0000 ug | INTRAMUSCULAR | Status: DC | PRN

## 2021-06-05 MED ORDER — PROPOFOL 10 MG/ML IV BOLUS
INTRAVENOUS | Status: AC
  Filled 2021-06-05: qty 20

## 2021-06-05 MED ORDER — ACETAMINOPHEN 500 MG PO TABS
ORAL_TABLET | ORAL | Status: AC
  Filled 2021-06-05: qty 2

## 2021-06-05 MED ORDER — FENTANYL CITRATE (PF) 100 MCG/2ML IJ SOLN
INTRAMUSCULAR | Status: AC
  Filled 2021-06-05: qty 2

## 2021-06-05 MED ORDER — ONDANSETRON HCL 4 MG/2ML IJ SOLN
INTRAMUSCULAR | Status: AC
  Filled 2021-06-05: qty 2

## 2021-06-05 MED ORDER — OXYCODONE HCL 5 MG/5ML PO SOLN: 5.0000 mg | Freq: Once | ORAL | Status: DC | PRN

## 2021-06-05 MED ORDER — FENTANYL CITRATE (PF) 100 MCG/2ML IJ SOLN
INTRAMUSCULAR | Status: DC | PRN
  Administered 2021-06-05: 25 ug via INTRAVENOUS

## 2021-06-05 MED ORDER — PROPOFOL 500 MG/50ML IV EMUL
INTRAVENOUS | Status: DC | PRN
Start: 2021-06-05 — End: 2021-06-05
  Administered 2021-06-05: 75 ug/kg/min via INTRAVENOUS

## 2021-06-05 MED ORDER — LACTATED RINGERS IV SOLN: INTRAVENOUS | Status: DC

## 2021-06-05 MED ORDER — BUPIVACAINE LIPOSOME 1.3 % IJ SUSP: INTRAMUSCULAR | Status: DC | PRN

## 2021-06-05 MED ORDER — DEXAMETHASONE SODIUM PHOSPHATE 4 MG/ML IJ SOLN
INTRAMUSCULAR | Status: DC | PRN
  Administered 2021-06-05: 5 mg via INTRAVENOUS

## 2021-06-05 MED ORDER — OXYCODONE HCL 5 MG PO TABS: 5.0000 mg | ORAL_TABLET | Freq: Four times a day (QID) | ORAL | 0 refills | Status: AC | PRN

## 2021-06-05 MED ORDER — KETOROLAC TROMETHAMINE 30 MG/ML IJ SOLN
INTRAMUSCULAR | Status: AC
  Filled 2021-06-05: qty 1

## 2021-06-05 MED ORDER — MIDAZOLAM HCL 2 MG/2ML IJ SOLN
INTRAMUSCULAR | Status: AC
Start: 2021-06-05 — End: ?
  Filled 2021-06-05: qty 2

## 2021-06-05 MED ORDER — ACETAMINOPHEN 500 MG PO TABS
1000.0000 mg | ORAL_TABLET | Freq: Once | ORAL | Status: AC
  Administered 2021-06-05: 1000 mg via ORAL

## 2021-06-05 MED ORDER — ONDANSETRON HCL 4 MG/2ML IJ SOLN
INTRAMUSCULAR | Status: DC | PRN
Start: 2021-06-05 — End: 2021-06-05
  Administered 2021-06-05: 4 mg via INTRAVENOUS

## 2021-06-05 MED ORDER — AMISULPRIDE (ANTIEMETIC) 5 MG/2ML IV SOLN: 10.0000 mg | Freq: Once | INTRAVENOUS | Status: DC | PRN

## 2021-06-05 MED ORDER — BUPIVACAINE HCL (PF) 0.25 % IJ SOLN
INTRAMUSCULAR | Status: DC | PRN
  Administered 2021-06-05: 8 mL

## 2021-06-05 MED ORDER — ONDANSETRON HCL 4 MG/2ML IJ SOLN: 4.0000 mg | Freq: Once | INTRAMUSCULAR | Status: DC | PRN

## 2021-06-05 MED ORDER — CEFAZOLIN SODIUM-DEXTROSE 2-3 GM-%(50ML) IV SOLR
INTRAVENOUS | Status: DC | PRN
  Administered 2021-06-05: 2 g via INTRAVENOUS

## 2021-06-05 MED ORDER — CEFAZOLIN SODIUM-DEXTROSE 2-3 GM-%(50ML) IV SOLR: INTRAVENOUS | Status: DC | PRN

## 2021-06-05 MED ORDER — CEFAZOLIN SODIUM-DEXTROSE 2-4 GM/100ML-% IV SOLN
INTRAVENOUS | Status: AC
  Filled 2021-06-05: qty 100

## 2021-06-05 MED ORDER — MIDAZOLAM HCL 5 MG/5ML IJ SOLN
INTRAMUSCULAR | Status: DC | PRN
  Administered 2021-06-05: 1 mg via INTRAVENOUS

## 2021-06-05 MED ORDER — OXYCODONE HCL 5 MG PO TABS: 5.0000 mg | ORAL_TABLET | Freq: Once | ORAL | Status: DC | PRN

## 2021-06-05 MED ORDER — DEXAMETHASONE SODIUM PHOSPHATE 10 MG/ML IJ SOLN
INTRAMUSCULAR | Status: AC
  Filled 2021-06-05: qty 1

## 2021-06-05 MED ORDER — LIDOCAINE 2% (20 MG/ML) 5 ML SYRINGE
INTRAMUSCULAR | Status: AC
  Filled 2021-06-05: qty 5

## 2021-06-05 SURGICAL SUPPLY — 42 items
APL PRP STRL LF DISP 70% ISPRP (MISCELLANEOUS) ×1
BLADE SURG 15 STRL LF DISP TIS (BLADE) ×1 IMPLANT
BLADE SURG 15 STRL SS (BLADE) ×2
BNDG CMPR 9X4 STRL LF SNTH (GAUZE/BANDAGES/DRESSINGS)
BNDG ELASTIC 3X5.8 VLCR STR LF (GAUZE/BANDAGES/DRESSINGS) ×2 IMPLANT
BNDG ESMARK 4X9 LF (GAUZE/BANDAGES/DRESSINGS) ×1 IMPLANT
BNDG GAUZE ELAST 4 BULKY (GAUZE/BANDAGES/DRESSINGS) ×2 IMPLANT
BNDG PLASTER X FAST 3X3 WHT LF (CAST SUPPLIES) ×10 IMPLANT
BNDG PLSTR 9X3 FST ST WHT (CAST SUPPLIES)
CHLORAPREP W/TINT 26 (MISCELLANEOUS) ×2 IMPLANT
CORD BIPOLAR FORCEPS 12FT (ELECTRODE) ×2 IMPLANT
COVER BACK TABLE 60X90IN (DRAPES) ×2 IMPLANT
COVER MAYO STAND STRL (DRAPES) ×2 IMPLANT
CUFF TOURN SGL QUICK 18X4 (TOURNIQUET CUFF) ×2 IMPLANT
CUFF TOURN SGL QUICK 24 (TOURNIQUET CUFF)
CUFF TRNQT CYL 24X4X16.5-23 (TOURNIQUET CUFF) IMPLANT
DRAPE EXTREMITY T 121X128X90 (DISPOSABLE) ×2 IMPLANT
DRAPE OEC MINIVIEW 54X84 (DRAPES) ×2 IMPLANT
DRAPE SURG 17X23 STRL (DRAPES) ×2 IMPLANT
GAUZE 4X4 16PLY ~~LOC~~+RFID DBL (SPONGE) ×1 IMPLANT
GAUZE SPONGE 4X4 12PLY STRL (GAUZE/BANDAGES/DRESSINGS) ×1 IMPLANT
GAUZE XEROFORM 1X8 LF (GAUZE/BANDAGES/DRESSINGS) ×2 IMPLANT
GLOVE SURG ENC MOIS LTX SZ7 (GLOVE) ×2 IMPLANT
GOWN STRL REUS W/ TWL LRG LVL3 (GOWN DISPOSABLE) ×1 IMPLANT
GOWN STRL REUS W/ TWL XL LVL3 (GOWN DISPOSABLE) ×1 IMPLANT
GOWN STRL REUS W/TWL LRG LVL3 (GOWN DISPOSABLE) ×2
GOWN STRL REUS W/TWL XL LVL3 (GOWN DISPOSABLE) ×2
NDL HYPO 25X1 1.5 SAFETY (NEEDLE) IMPLANT
NEEDLE HYPO 25X1 1.5 SAFETY (NEEDLE) ×2 IMPLANT
NS IRRIG 1000ML POUR BTL (IV SOLUTION) ×2 IMPLANT
PACK BASIN DAY SURGERY FS (CUSTOM PROCEDURE TRAY) ×2 IMPLANT
PAD CAST 3X4 CTTN HI CHSV (CAST SUPPLIES) ×1 IMPLANT
PADDING CAST COTTON 3X4 STRL (CAST SUPPLIES) ×2
SLEEVE SCD COMPRESS KNEE MED (STOCKING) IMPLANT
SPLINT FIBERGLASS 4X30 (CAST SUPPLIES) ×1 IMPLANT
SUT ETHILON 4 0 PS 2 18 (SUTURE) ×2 IMPLANT
SUT MNCRL AB 3-0 PS2 18 (SUTURE) ×1 IMPLANT
SUT VICRYL 4-0 PS2 18IN ABS (SUTURE) IMPLANT
SYR BULB EAR ULCER 3OZ GRN STR (SYRINGE) ×2 IMPLANT
SYR CONTROL 10ML LL (SYRINGE) ×1 IMPLANT
TOWEL GREEN STERILE FF (TOWEL DISPOSABLE) ×4 IMPLANT
UNDERPAD 30X36 HEAVY ABSORB (UNDERPADS AND DIAPERS) ×2 IMPLANT

## 2021-06-05 NOTE — Interval H&P Note (Signed)
History and Physical Interval Note: ? ?06/05/2021 ?9:56 AM ? ?Natalie Whitney  has presented today for surgery, with the diagnosis of RIGHT SMALL FINGER PIP ARTHRITIS.  The various methods of treatment have been discussed with the patient and family. After consideration of risks, benefits and other options for treatment, the patient has consented to  Procedure(s): ?RIGHT SMALL FINGER REVISION INTERPHALANGEAL FUSION (Right) as a surgical intervention.  The patient's history has been reviewed, patient examined, no change in status, stable for surgery.  I have reviewed the patient's chart and labs.  Questions were answered to the patient's satisfaction.   ? ? ?[name] Amity Roes ? ? ?

## 2021-06-05 NOTE — Brief Op Note (Signed)
06/05/2021 ? ?11:00 AM ? ?PATIENT:  Natalie Whitney  76 y.o. female ? ?PRE-OPERATIVE DIAGNOSIS:  RIGHT SMALL FINGER PIP ARTHRITIS ? ?POST-OPERATIVE DIAGNOSIS:  RIGHT SMALL FINGER PIP ARTHRITIS ? ?PROCEDURE:  Procedure(s): ?RIGHT SMALL FINGER REVISION INTERPHALANGEAL FUSION (Right) ? ?SURGEON:  Surgeon(s) and Role: ?   * Sherilyn Cooter, MD - Primary ? ?PHYSICIAN ASSISTANT:  ? ?ASSISTANTS: none  ? ?ANESTHESIA:   local and MAC ? ?EBL:  5 mL  ? ?BLOOD ADMINISTERED:none ? ?DRAINS: none  ? ?LOCAL MEDICATIONS USED:  BUPIVICAINE  ? ?SPECIMEN:  No Specimen ? ?DISPOSITION OF SPECIMEN:  N/A ? ?COUNTS:  YES ? ?TOURNIQUET:   ?Total Tourniquet Time Documented: ?Forearm (Right) - 17 minutes ?Total: Forearm (Right) - 17 minutes ? ? ?DICTATION: .Dragon Dictation ? ?PLAN OF CARE: Discharge to home after PACU ? ?PATIENT DISPOSITION:  PACU - hemodynamically stable. ?  ?Delay start of Pharmacological VTE agent (>24hrs) due to surgical blood loss or risk of bleeding: not applicable ? ?

## 2021-06-05 NOTE — Op Note (Signed)
? ?  Date of Surgery: 06/05/2021 ? ?INDICATIONS: Ms. Devincent is a 76 y.o.-year-old female with a right small finger PIP arthrodesis with a tension band construct for failed conservative treatment of a central slip injury in the setting of osteoarthritis.  One of the k-wires was found to be backing out with slight erythema of the overlying skin.  Discussed with patient that at 6 weeks, it is too early to remove the hardware due to increased risk of nonunion.  For that reason, I recommended advancement of the k-wire to take pressure off of her skin but maintain the tension band construct so as to not diminish the likelihood of arthrodesis.  Risks, benefits, and alternatives to surgery were again discussed with the patient wishing to proceed with surgery.  Informed consent was signed after our discussion.  ? ?PREOPERATIVE DIAGNOSIS:  ?Symptomatic orthopedic hardware ? ?POSTOPERATIVE DIAGNOSIS: Same. ? ?PROCEDURE:  ?Revision right small finger PIP arthrodesis with advancement of the k-wire ? ? ?SURGEON: Audria Nine, M.D. ? ?ASSIST:  ? ?ANESTHESIA:  Local, MAC ? ?IV FLUIDS AND URINE: See anesthesia. ? ?ESTIMATED BLOOD LOSS: <5 mL. ? ?IMPLANTS: None ?  ? ?DRAINS: None ? ?COMPLICATIONS: None ? ?DESCRIPTION OF PROCEDURE: The patient was met in the preoperative holding area where the surgical site was marked and the consent form was verified.  The patient was then taken to the operating room and transferred to the operating table.  All bony prominences were well padded.  A tourniquet was applied to the right upper arm.  Monitored sedation was induced.  A formal time-out was performed to confirm that this was the correct patient, surgery, side, and site. A digital block was performed using 7cc of 0.25% plain bupivicaine. The operative extremity was prepped and draped in the usual and sterile fashion.   ? ?Following a second timeout, the tourniquet was inflated to 250 mmHg.  A small incision was made directly over the k-wire.   The skin and extensor mechanism was divided.  The k-wire was identified.  A needle driver was used to gently advance the k-wiare back to its original position.  Fluoroscopy showed that the construct remained intact with the wire against the proximal phalanx.  The wound was then thoroughly irrigated with sterile saline.  It was closed using a 4-0 nylon suture with deep bites to incorporate the extensor mechanism into the repair.  The tourniquet was let down.  The wound was dressed with xeroform, folded kerlix, and an ulnar gutter splint. ? ?She was then reversed from sedation and transferred to the postoperative bed.  All counts were correct x 2 at the end of the procedure.  She was then taken to the PACU in stable condition.  ? ?POSTOPERATIVE PLAN: She will be discharged to home with appropriate pain medication and discharge instructions.  I'll see her back in 10-14 days for suture removal.  ? ?Audria Nine, MD ?11:07 AM  ?

## 2021-06-05 NOTE — Anesthesia Postprocedure Evaluation (Addendum)
Anesthesia Post Note ? ?Patient: Natalie Whitney ? ?Procedure(s) Performed: RIGHT SMALL FINGER REVISION INTERPHALANGEAL FUSION (Right) ? ?  ? ?Patient location during evaluation: PACU ?Anesthesia Type: MAC ?Level of consciousness: awake ?Pain management: pain level controlled ?Vital Signs Assessment: post-procedure vital signs reviewed and stable ?Respiratory status: spontaneous breathing and respiratory function stable ?Cardiovascular status: stable ?Postop Assessment: no apparent nausea or vomiting ?Anesthetic complications: no ? ? ?No notable events documented. ? ?Last Vitals:  ?Vitals:  ? 06/05/21 1115 06/05/21 1130  ?BP: (!) 94/57 91/61  ?Pulse: 72 74  ?Resp: 14 16  ?Temp:  (!) 36.4 ?C  ?SpO2: 100% 97%  ?  ?Last Pain:  ?Vitals:  ? 06/05/21 1130  ?TempSrc:   ?PainSc: 0-No pain  ? ? ?  ?  ?  ?  ?  ?  ? ?Merlinda Frederick ? ? ? ? ?

## 2021-06-05 NOTE — Anesthesia Preprocedure Evaluation (Addendum)
Anesthesia Evaluation  ?Patient identified by MRN, date of birth, ID band ?Patient awake ? ? ? ?Reviewed: ?Allergy & Precautions, NPO status , Patient's Chart, lab work & pertinent test results ? ?Airway ?Mallampati: II ? ?TM Distance: >3 FB ?Neck ROM: Full ? ? ? Dental ?no notable dental hx. ? ?  ?Pulmonary ?sleep apnea ,  ?  ?Pulmonary exam normal ?breath sounds clear to auscultation ? ? ? ? ? ? Cardiovascular ?hypertension, negative cardio ROS ?Normal cardiovascular exam ?Rhythm:Regular Rate:Normal ? ? ?  ?Neuro/Psych ?negative neurological ROS ? negative psych ROS  ? GI/Hepatic ?negative GI ROS, Neg liver ROS,   ?Endo/Other  ?negative endocrine ROSdiabetes, Type 2, Oral Hypoglycemic Agents ? Renal/GU ?negative Renal ROS  ?negative genitourinary ?  ?Musculoskeletal ?negative musculoskeletal ROS ?(+) Arthritis ,  ? Abdominal ?  ?Peds ?negative pediatric ROS ?(+)  Hematology ?negative hematology ROS ?(+)   ?Anesthesia Other Findings ? ? Reproductive/Obstetrics ?negative OB ROS ? ?  ? ? ? ? ? ? ? ? ? ? ? ? ? ?  ?  ? ? ? ? ? ? ?Anesthesia Physical ?Anesthesia Plan ? ?ASA: 3 ? ?Anesthesia Plan: MAC  ? ?Post-op Pain Management: Tylenol PO (pre-op)*  ? ?Induction: Intravenous ? ?PONV Risk Score and Plan: Ondansetron, Dexamethasone and Treatment may vary due to age or medical condition ? ?Airway Management Planned: LMA ? ?Additional Equipment: None ? ?Intra-op Plan:  ? ?Post-operative Plan: Extubation in OR ? ?Informed Consent: I have reviewed the patients History and Physical, chart, labs and discussed the procedure including the risks, benefits and alternatives for the proposed anesthesia with the patient or authorized representative who has indicated his/her understanding and acceptance.  ? ? ? ?Dental advisory given ? ?Plan Discussed with: CRNA, Anesthesiologist and Surgeon ? ?Anesthesia Plan Comments: (Local by surgeon. MAC. Propofol gtt. GA/LMA as backup plan. Norton Blizzard, MD  ?   ?)  ? ? ? ? ?Anesthesia Quick Evaluation ? ?

## 2021-06-05 NOTE — Discharge Instructions (Addendum)
? ? ?Waylan Rocher, M.D. ?Hand Surgery ? ?POST-OPERATIVE DISCHARGE INSTRUCTIONS ? ? ?PRESCRIPTIONS: ?You may have been given a prescription to be taken as directed for post-operative pain control.  You may also take over the counter ibuprofen/aleve and tylenol for pain. Take this as directed on the packaging. Do not exceed 3000 mg tylenol/acetaminophen in 24 hours. ? ?Ibuprofen 600-800 mg (3-4) tablets by mouth every 6 hours as needed for pain.  ?OR ?Aleve 2 tablets by mouth every 12 hours (twice daily) as needed for pain.  ?AND/OR ?Tylenol 1000 mg (2 tablets) every 8 hours as needed for pain. ? ?Please use your pain medication carefully, as refills are limited and you may not be provided with one.  As stated above, please use over the counter pain medicine - it will also be helpful with decreasing your swelling.  ? ? ?ANESTHESIA: ?After your surgery, post-surgical discomfort or pain is likely. This discomfort can last several days to a few weeks. At certain times of the day your discomfort may be more intense.  ? ?Did you receive a nerve block?  ?A nerve block can provide pain relief for one hour to two days after your surgery. As long as the nerve block is working, you will experience little or no sensation in the area the surgeon operated on.  ?As the nerve block wears off, you will begin to experience pain or discomfort. It is very important that you begin taking your prescribed pain medication before the nerve block fully wears off. Treating your pain at the first sign of the block wearing off will ensure your pain is better controlled and more tolerable when full-sensation returns. Do not wait until the pain is intolerable, as the medicine will be less effective. It is better to treat pain in advance than to try and catch up.  ? ?General Anesthesia:  ?If you did not receive a nerve block during your surgery, you will need to start taking your pain medication shortly after your surgery and should continue  to do so as prescribed by your surgeon.   ? ? ?ICE AND ELEVATION: ?You may use ice for the first 48-72 hours, but it is not critical.   ?Elevation, as much as possible for the next 48 hours, is critical for decreasing swelling as well as for pain relief. Elevation means when you are seated or lying down, you hand should be at or above your heart. When walking, the hand needs to be at or above the level of your elbow.  ?If the bandage gets too tight, it may need to be loosened. Please contact our office and we will instruct you in how to do this.  ? ? ?SURGICAL BANDAGES:  ?Keep your dressing and/or splint clean and dry at all times.  Do not remove until you are seen again in the office.  If careful, you may place a plastic bag over your bandage and tape the end to shower, but be careful, do not get your bandages wet.  ?  ? ?HAND THERAPY:  ?You may not need any. If you do, we will begin this at your follow up visit in the clinic.  ? ? ?ACTIVITY AND WORK: ?You are encouraged to move any fingers which are not in the bandage.  ?Light use of the fingers is allowed to assist the other hand with daily hygiene and eating, but strong gripping or lifting is often uncomfortable and should be avoided.  ?You might miss a variable period of time  from work and hopefully this issue has been discussed prior to surgery. You may not do any heavy work with your affected hand for about 2 weeks.  ? ? ?[facility] ?[address] ?[place],  [place]  [address] ?[phone]  ? ? ?No Tylenol before 4:00pm if needed. ? ?Post Anesthesia Home Care Instructions ? ?Activity: ?Get plenty of rest for the remainder of the day. A responsible individual must stay with you for 24 hours following the procedure.  ?For the next 24 hours, DO NOT: ?-Drive a car ?-Advertising copywriter ?-Drink alcoholic beverages ?-Take any medication unless instructed by your physician ?-Make any legal decisions or sign important papers. ? ?Meals: ?Start  with liquid foods such as gelatin or soup. Progress to regular foods as tolerated. Avoid greasy, spicy, heavy foods. If nausea and/or vomiting occur, drink only clear liquids until the nausea and/or vomiting subsides. Call your physician if vomiting continues. ? ?Special Instructions/Symptoms: ?Your throat may feel dry or sore from the anesthesia or the breathing tube placed in your throat during surgery. If this causes discomfort, gargle with warm salt water. The discomfort should disappear within 24 hours. ? ?If you had a scopolamine patch placed behind your ear for the management of post- operative nausea and/or vomiting: ? ?1. The medication in the patch is effective for 72 hours, after which it should be removed.  Wrap patch in a tissue and discard in the trash. Wash hands thoroughly with soap and water. ?2. You may remove the patch earlier than 72 hours if you experience unpleasant side effects which may include dry mouth, dizziness or visual disturbances. ?3. Avoid touching the patch. Wash your hands with soap and water after contact with the patch. ?    ?

## 2021-06-05 NOTE — Transfer of Care (Signed)
Immediate Anesthesia Transfer of Care Note ? ?Patient: Natalie Whitney ? ?Procedure(s) Performed: RIGHT SMALL FINGER REVISION INTERPHALANGEAL FUSION (Right) ? ?Patient Location: PACU ? ?Anesthesia Type:MAC ? ?Level of Consciousness: awake, alert  and oriented ? ?Airway & Oxygen Therapy: Patient Spontanous Breathing and Patient connected to nasal cannula oxygen ? ?Post-op Assessment: Report given to RN and Post -op Vital signs reviewed and stable ? ?Post vital signs: Reviewed and stable ? ?Last Vitals:  ?Vitals Value Taken Time  ?BP    ?Temp    ?Pulse 78 06/05/21 1101  ?Resp 17 06/05/21 1102  ?SpO2 98 % 06/05/21 1101  ?Vitals shown include unvalidated device data. ? ?Last Pain:  ?Vitals:  ? 06/05/21 0941  ?TempSrc: Oral  ?PainSc: 0-No pain  ?   ? ?Patients Stated Pain Goal: 4 (06/05/21 0941) ? ?Complications: No notable events documented. ?

## 2021-06-06 ENCOUNTER — Encounter (HOSPITAL_BASED_OUTPATIENT_CLINIC_OR_DEPARTMENT_OTHER): Payer: Self-pay | Admitting: Orthopedic Surgery

## 2021-06-18 ENCOUNTER — Ambulatory Visit (INDEPENDENT_AMBULATORY_CARE_PROVIDER_SITE_OTHER): Payer: Medicare Other | Admitting: Orthopedic Surgery

## 2021-06-18 ENCOUNTER — Ambulatory Visit: Payer: Self-pay

## 2021-06-18 DIAGNOSIS — M20021 Boutonniere deformity of right finger(s): Secondary | ICD-10-CM

## 2021-06-18 NOTE — Progress Notes (Signed)
? ?  Post-Op Visit Note ?  ?Patient: Natalie Whitney           ?Date of Birth: [birth year]           ?MRN: [id number] ?Visit Date: 06/18/2021 ?PCP: Alinda Deem, MD ? ? ?Assessment & Plan: ? ?Chief Complaint:  ?Chief Complaint  ?Patient presents with  ? Left Little Finger - Routine Post Op  ? ?Visit Diagnoses:  ?1. Central slip extensor tendon injury (boutonniere), right   ? ? ?Plan: Patient is two weeks s/p revision of her tension band.  One of the k-wires had backed out and was been pushed back in.  There is no clinical motion at the joint.  Her sutures were removed.  We will continue immobilization with a thermoplast splint.  I can see her back in a month with another x-ray.  ? ?Follow-Up Instructions: No follow-ups on file.  ? ?Orders:  ?No orders of the defined types were placed in this encounter. ? ?No orders of the defined types were placed in this encounter. ? ? ?Imaging: ?No results found. ? ?PMFS History: ?Patient Active Problem List  ? Diagnosis Date Noted  ? Presence of retained hardware   ? Central slip extensor tendon injury (boutonniere), right 12/20/2020  ? 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 ?Past Medical History:  ?Diagnosis Date  ? Arthritis   ? Diabetes mellitus without complication (HCC)   ? Family history of adverse reaction to anesthesia   ? daughter has nausea  ? Heart murmur   ? History of hiatal hernia   ? Hypertension   ? Pneumonia   ? Sleep apnea   ? stopped cpap  ?  ?No family history on file.  ?Past Surgical History:  ?Procedure Laterality Date  ? APPENDECTOMY    ? BREAST SURGERY    ? breast biopsy- unsure of side  ? COLONOSCOPY    ? DILATION AND CURETTAGE OF UTERUS    ? EYE SURGERY  2020  ? bilateral cataract removal  ? FINGER ARTHRODESIS Right 04/22/2021  ? Procedure: Right small finger PIP ARTHRODESIS FINGER;  Surgeon: Marlyne Beards, MD;  Location: [facility];  Service: Orthopedics;  Laterality: Right;  ? PROXIMAL INTERPHALANGEAL FUSION (PIP) Right 06/05/2021  ? Procedure: RIGHT  SMALL FINGER REVISION INTERPHALANGEAL FUSION;  Surgeon: Marlyne Beards, MD;  Location: [facility];  Service: Orthopedics;  Laterality: Right;  ? 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  ? Procedure: TOTAL KNEE ARTHROPLASTY;  Surgeon: Nadara Mustard, MD;  Location: [facility];  Service: Orthopedics;  Laterality: Right;  ? ?Social History  ? ?Occupational History  ? Not on file  ?Tobacco Use  ? Smoking status: Never  ? Smokeless tobacco: Never  ?Vaping Use  ? Vaping Use: Never used  ?Substance and Sexual Activity  ? Alcohol use: No  ? Drug use: No  ? Sexual activity: Not on file  ? ? ? ?

## 2021-06-19 NOTE — Addendum Note (Signed)
Addended by: Sherilyn Cooter on: 06/19/2021 11:03 AM ? ? Modules accepted: Orders ? ?

## 2021-07-18 ENCOUNTER — Ambulatory Visit: Payer: PRIVATE HEALTH INSURANCE | Admitting: Orthopedic Surgery

## 2021-07-18 ENCOUNTER — Encounter: Payer: PRIVATE HEALTH INSURANCE | Admitting: Rehabilitative and Restorative Service Providers"

## 2021-07-23 ENCOUNTER — Ambulatory Visit: Payer: Self-pay

## 2021-07-23 ENCOUNTER — Encounter: Payer: PRIVATE HEALTH INSURANCE | Admitting: Rehabilitative and Restorative Service Providers"

## 2021-07-23 ENCOUNTER — Ambulatory Visit (INDEPENDENT_AMBULATORY_CARE_PROVIDER_SITE_OTHER): Payer: Medicare Other | Admitting: Orthopedic Surgery

## 2021-07-23 DIAGNOSIS — M20021 Boutonniere deformity of right finger(s): Secondary | ICD-10-CM

## 2021-07-23 NOTE — Progress Notes (Signed)
? ?  Post-Op Visit Note ?  ?Patient: Natalie Whitney           ?Date of Birth: September 22, 1945           ?MRN: [id number] ?Visit Date: 07/23/2021 ?PCP: Earney Mallet, MD ? ? ?Assessment & Plan: ? ?Chief Complaint:  ?Chief Complaint  ?Patient presents with  ? Right Little Finger - Routine Post Op  ? ?Visit Diagnoses:  ?1. Central slip extensor tendon injury (boutonniere), right   ? ? ?Plan: Patient is 6 and half weeks out from revision right small finger PIP arthrodesis with advancement of a K wire that was backing out.  She is doing well.  Has been in a splint since then.  She has no pain in the joint.  The incision remains well-healed.  There is no evidence of backing out of the K wire.  X-rays today show likely interval healing of the arthrodesis site.  I can see her back in another 6 weeks  ? ?Follow-Up Instructions: No follow-ups on file.  ? ?Orders:  ?Orders Placed This Encounter  ?Procedures  ? XR Finger Little Right  ? ?No orders of the defined types were placed in this encounter. ? ? ?Imaging: ?No results found. ? ?PMFS History: ?Patient Active Problem List  ? Diagnosis Date Noted  ? Presence of retained hardware   ? Central slip extensor tendon injury (boutonniere), right 12/20/2020  ? 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 ?Past Medical History:  ?Diagnosis Date  ? Arthritis   ? Diabetes mellitus without complication ([place])   ? Family history of adverse reaction to anesthesia   ? daughter has nausea  ? Heart murmur   ? History of hiatal hernia   ? Hypertension   ? Pneumonia   ? Sleep apnea   ? stopped cpap  ?  ?No family history on file.  ?Past Surgical History:  ?Procedure Laterality Date  ? APPENDECTOMY    ? BREAST SURGERY    ? breast biopsy- unsure of side  ? COLONOSCOPY    ? DILATION AND CURETTAGE OF UTERUS    ? EYE SURGERY  2020  ? bilateral cataract removal  ? FINGER ARTHRODESIS Right 04/22/2021  ? Procedure: Right small finger PIP ARTHRODESIS FINGER;  Surgeon: Sherilyn Cooter, MD;  Location: [place] [facility];   Service: Orthopedics;  Laterality: Right;  ? PROXIMAL INTERPHALANGEAL FUSION (PIP) Right 06/05/2021  ? Procedure: RIGHT SMALL FINGER REVISION INTERPHALANGEAL FUSION;  Surgeon: Sherilyn Cooter, MD;  Location: [place];  Service: Orthopedics;  Laterality: Right;  ? 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  ? Procedure: TOTAL KNEE ARTHROPLASTY;  Surgeon: Newt Minion, MD;  Location: [facility];  Service: Orthopedics;  Laterality: Right;  ? ?Social History  ? ?Occupational History  ? Not on file  ?Tobacco Use  ? Smoking status: Never  ? Smokeless tobacco: Never  ?Vaping Use  ? Vaping Use: Never used  ?Substance and Sexual Activity  ? Alcohol use: No  ? Drug use: No  ? Sexual activity: Not on file  ? ? ? ?

## 2021-07-23 NOTE — Therapy (Incomplete)
?OUTPATIENT OCCUPATIONAL THERAPY TREATMENT NOTE ? ? ?Patient Name: Natalie Whitney ?MRN: [id number] ?DOB:1945-06-03, 76 y.o., female ?Today's Date: 07/23/2021 ? ?PCP: Earney Mallet, MD ?REFERRING PROVIDER: Sherilyn Cooter, MD ? ?END OF SESSION:  ? ? ?Past Medical History:  ?Diagnosis Date  ? Arthritis   ? Diabetes mellitus without complication ([place])   ? Family history of adverse reaction to anesthesia   ? daughter has nausea  ? Heart murmur   ? History of hiatal hernia   ? Hypertension   ? Pneumonia   ? Sleep apnea   ? stopped cpap  ? ?Past Surgical History:  ?Procedure Laterality Date  ? APPENDECTOMY    ? BREAST SURGERY    ? breast biopsy- unsure of side  ? COLONOSCOPY    ? DILATION AND CURETTAGE OF UTERUS    ? EYE SURGERY  2020  ? bilateral cataract removal  ? FINGER ARTHRODESIS Right 04/22/2021  ? Procedure: Right small finger PIP ARTHRODESIS FINGER;  Surgeon: Sherilyn Cooter, MD;  Location: [place];  Service: Orthopedics;  Laterality: Right;  ? PROXIMAL INTERPHALANGEAL FUSION (PIP) Right 06/05/2021  ? Procedure: RIGHT SMALL FINGER REVISION INTERPHALANGEAL FUSION;  Surgeon: Sherilyn Cooter, MD;  Location: [place] [facility];  Service: Orthopedics;  Laterality: Right;  ? 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  ? Procedure: TOTAL KNEE ARTHROPLASTY;  Surgeon: Newt Minion, MD;  Location: [place];  Service: Orthopedics;  Laterality: Right;  ? ?Patient Active Problem List  ? Diagnosis Date Noted  ? Presence of retained hardware   ? Central slip extensor tendon injury (boutonniere), right 12/20/2020  ? 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 ? ?ONSET DATE: 06/05/21 MOST RECENT SX  ?  ?REFERRING DIAG: M20.021 (ICD-10-CM) - Central slip extensor tendon injury (boutonniere), right ? ?THERAPY DIAG:  ?No diagnosis found. ? ? ?PERTINENT HISTORY: 7 weeks post op (most rect revision sx) arthrodesis now; plan is to have her pins removed (as they seem to be backing out) next week; she previously failed conservative  tx, past issues with skin breakdown, etc.  ?  ?PRECAUTIONS: none ? ?SUBJECTIVE:  ?She states *** ? ?PAIN:  ?Are you having pain? *** ?Rating: ***/10 at rest now  ? ? ? ?OBJECTIVE: (All objective assessments below are from initial evaluation on: 05/30/21 unless otherwise specified.)  ? ?HAND DOMINANCE: Right ?  ?ADLs: ?Overall ADLs: difficulty with opening containers, left shoulder hurts when "lifting my tea cup," difficulty reaching to bathe, clumsy with right hand ?  ?FUNCTIONAL OUTCOME MEASURES: ?07/23/21: Katina Dung: *** % impairment today ?  ?UE ROM    ?  ?Active ROM Right ?05/30/2021 Left ?05/30/2021 Right (SF)  ?07/23/21 Left (shoulder) ?07/23/21  ?Shoulder flexion   ~85* and painful (PROM goes to ~135* no pain)    ?Shoulder abduction        ?Shoulder adduction        ?Shoulder extension        ?Shoulder internal rotation   ~40* PROM    ?Shoulder external rotation   ~90* PROM    ?Elbow flexion        ?Elbow extension        ?Wrist flexion 40 62    ?Wrist extension 37 50    ?Wrist ulnar deviation        ?Wrist radial deviation        ?Wrist pronation 85      ?Wrist supination 85      ?(Blank rows = not tested) ?  ?Active ROM  Right ?05/30/2021 Right ?07/23/21  ?Little MCP (0-90)  0-38*   ?Little PIP (0-100)  fused @ 50*    ?Little DIP (0-70)  10* hyper ext, no motion today   ?(Blank rows = not tested) ?  ?  ?UE MMT:   Rt hand NT due to fusion, etc.  ?  ?MMT Left ?05/30/2021 Left ?07/23/21  ?Shoulder flexion Painful 3-/5 MMT   ?Shoulder abduction 3-/5 MMT pain   ?Shoulder adduction     ?Shoulder extension     ?Shoulder internal rotation     ?Shoulder external rotation     ?(Blank rows = not tested) ?  ?HAND FUNCTION: ?07/23/21: Grip strength: Right: *** lbs; Left: *** lbs ?  ?COORDINATION: ?07/23/21: Box and Blocks: *** blocks today (*** is [facility])  ?  ?SENSATION: ?No c/o today ?  ?EDEMA: swelling about rt SF PIP J, some redness and small 51m x 336mabrasion dorsally ?  ?COGNITION: ?Overall cognitive status: Within functional  limits for tasks assessed ?  ?OBSERVATIONS: Rt SF DIP J is somewhat contracted in hyperextension, resembling Boutonniere Deformity, no active motion at DIP today but flexible passively ~10-15* in total. Rt wrist somewhat tight as well.  ?  ?Lt shoulder: positive Neer's Impingement, Negative Drop-Arm Test, positive Jobe's Test, also possible biceps tendon involvement.  ?  ?  ?TODAY'S TREATMENT:  ?07/23/21: *** ? ? ?05/30/21 Eval: OT fabricates custom rt SF PIP J orthosis to statically protect PIP J until sx to remove pins.  Once sx done, it can be reapplied like clamshell with coban wrap to hold in place.  OT will likely need to fabricate a more appropriate, non-bulky orthotic after sx as well.  It may also be appropriate to perform static serial casting to DIP J to stop hyperextension contracture.  Additionally, OT quickly edu on HEP for 3x day Rt wrist flex, ext stretches as well as b/l sh retraction isometrics to help apparent shoulder impingement in lt shoulder. She demo's back, states understanding.  ?  ?  ?PATIENT EDUCATION: ?Education details: see tx section above for details  ?Person educated: Patient and sister ?Education method: Explanation and Demonstration ?Education comprehension: verbalized understanding, returned demonstration, and needs further education ?  ?  ?HOME EXERCISE PROGRAM: ?See above tx section for details  ?  ?GOALS: ?Goals reviewed with patient? Yes ?  ?SHORT TERM GOALS: (STG required if POC>30 days) ?  ?Pt will obtain protective, custom orthotic. ?Target date: 05/30/21 ?Goal status: MET- however will likely need updated orthosis after sx as well  ?  ?2.  Pt will demo/state understanding of initial HEP to improve pain levels and prerequisite motion. ?Target date: 06/13/21 ?Goal status: INITIAL ?  ?  ?LONG TERM GOALS: ?  ?Pt will improve functional ability by decreased impairment per Quick DASH assessment from TBD% to 15% or better, for better quality of life. ?Target date: 06/28/21 ?Goal  status: INITIAL ?  ?2.  Pt will improve grip strength in right hand to at least 25 lbs for functional use at home and in IADLs. ?Target date: 06/28/21 ?Goal status: INITIAL ?  ?3.  Pt will improve A/ROM in rt wrist flex/ext from ~40* each to at least 55* each, to have functional motion for tasks like reach and grasp.  ?Target date: 06/28/21 ?Goal status: INITIAL ?  ?4.  Pt will improve strength in left shoulder flexion from painful 3-/5 MMT to at least 4/5 MMT to have increased functional ability to carry out selfcare and higher-level homecare tasks  with no difficulty. ?Target date: 06/28/21 ?Goal status: INITIAL ?  ?  ?  ?ASSESSMENT: ?  ?CLINICAL IMPRESSION: ?07/23/21: *** ? ?05/30/21: Patient is a 76 y.o. female who was seen today for occupational therapy evaluation for decreased functional ability and pain due to right SF fusion and healing issues, stiffness in wrist/hand and painful left shoulder.  ?  ?  ?  ?PLAN: ?OT FREQUENCY: 1-2x/week ?  ?OT DURATION: 4 weeks ?  ?PLANNED INTERVENTIONS: self care/ADL training, therapeutic exercise, therapeutic activity, neuromuscular re-education, manual therapy, scar mobilization, passive range of motion, splinting, ultrasound, fluidotherapy, compression bandaging, moist heat, cryotherapy, contrast bath, patient/family education, energy conservation, coping strategies training, and DME and/or AE instructions ?  ?RECOMMENDED OTHER SERVICES: none other at the moment  ?  ?CONSULTED AND AGREED WITH PLAN OF CARE: Patient and family member/caregiver ?  ?PLAN FOR NEXT SESSION:  ?*** ? ?Check and possibly create new orthotic post sx, review HEP for wrist stretches and shoulder retraction, add more HEP as appropriate ? ? ?Benito Mccreedy, OTR/L, CHT ?07/23/2021, 9:09 AM ? ?  ? ?  ?

## 2021-08-07 ENCOUNTER — Ambulatory Visit (INDEPENDENT_AMBULATORY_CARE_PROVIDER_SITE_OTHER): Payer: Medicare Other | Admitting: Orthopaedic Surgery

## 2021-08-07 ENCOUNTER — Other Ambulatory Visit (HOSPITAL_BASED_OUTPATIENT_CLINIC_OR_DEPARTMENT_OTHER): Payer: Self-pay

## 2021-08-07 ENCOUNTER — Ambulatory Visit (INDEPENDENT_AMBULATORY_CARE_PROVIDER_SITE_OTHER): Payer: Medicare Other

## 2021-08-07 DIAGNOSIS — M25512 Pain in left shoulder: Secondary | ICD-10-CM | POA: Diagnosis not present

## 2021-08-07 DIAGNOSIS — M12812 Other specific arthropathies, not elsewhere classified, left shoulder: Secondary | ICD-10-CM

## 2021-08-07 MED ORDER — IBUPROFEN 800 MG PO TABS
800.0000 mg | ORAL_TABLET | Freq: Three times a day (TID) | ORAL | 0 refills | Status: AC
  Filled 2021-08-07: qty 30, 10d supply, fill #0

## 2021-08-07 MED ORDER — ACETAMINOPHEN 500 MG PO TABS
500.0000 mg | ORAL_TABLET | Freq: Three times a day (TID) | ORAL | 0 refills | Status: AC
  Filled 2021-08-07: qty 30, 10d supply, fill #0

## 2021-08-07 MED ORDER — OXYCODONE HCL 5 MG PO TABS
5.0000 mg | ORAL_TABLET | ORAL | 0 refills | Status: AC | PRN
  Filled 2021-08-07: qty 20, 4d supply, fill #0

## 2021-08-07 MED ORDER — ASPIRIN 325 MG PO TBEC
325.0000 mg | DELAYED_RELEASE_TABLET | Freq: Every day | ORAL | 0 refills | Status: AC
  Filled 2021-08-07: qty 30, 30d supply, fill #0

## 2021-08-07 NOTE — Progress Notes (Signed)
Chief Complaint: Left shoulder pain     History of Present Illness:    Natalie Whitney is a 76 y.o. female right-hand-dominant presents with left shoulder pain that has been bothering her significantly since January.  At this time she did have a fall against a wall for which she suffered multiple injuries.  She did have a right handed small finger fracture which was treated by my partner Dr. Frazier Butt.  Since that time she is noted significant weakness and limited range of motion about the left shoulder.  This has progressed significantly over the course of the last several months.  She is having a very difficult time with basic things reaching the arm up to the face to do grooming or her hair.  She endorses significant weakness with overhead activity and pain in the lateral aspect of the shoulder particularly at night with laying directly on the side.  She did have an injection 1 month prior with her primary doctor which gave her minimal relief.  She has been in physical therapy for multiple issues including the back and hand which has afforded her the opportunity to work on the shoulder with little relief.  At this time she is quite concerned about her ability to work overhead or do basic things like grooming.  She was previously very active in tennis although she is no longer able to do this as a result of her shoulder and contralateral small finger issue.  She is a non-smoker.  She does have type 2 diabetes with a last A1c in the fives.    Surgical History:   None regarding left shoulder  PMH/PSH/Family History/Social History/Meds/Allergies:    Past Medical History:  Diagnosis Date   Arthritis    Diabetes mellitus without complication (HCC)    Family history of adverse reaction to anesthesia    daughter has nausea   Heart murmur    History of hiatal hernia    Hypertension    Pneumonia    Sleep apnea    stopped cpap   Past Surgical History:   Procedure Laterality Date   APPENDECTOMY     BREAST SURGERY     breast biopsy- unsure of side   COLONOSCOPY     DILATION AND CURETTAGE OF UTERUS     EYE SURGERY  2020   bilateral cataract removal   FINGER ARTHRODESIS Right 04/22/2021   Procedure: Right small finger PIP ARTHRODESIS FINGER;  Surgeon: Marlyne Beards, MD;  Location: [facility] OR;  Service: Orthopedics;  Laterality: Right;   PROXIMAL INTERPHALANGEAL FUSION (PIP) Right 06/05/2021   Procedure: RIGHT SMALL FINGER REVISION INTERPHALANGEAL FUSION;  Surgeon: Marlyne Beards, MD;  Location: [facility];  Service: Orthopedics;  Laterality: Right;   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   Procedure: TOTAL KNEE ARTHROPLASTY;  Surgeon: Nadara Mustard, MD;  Location: [facility] OR;  Service: Orthopedics;  Laterality: Right;   Social History   Socioeconomic History   Marital status: Divorced    Spouse name: Not on file   Number of children: Not on file   Years of education: Not on file   Highest education level: Not on file  Occupational History   Not on file  Tobacco Use   Smoking status: Never   Smokeless tobacco: Never  Vaping Use   Vaping Use: Never  used  Substance and Sexual Activity   Alcohol use: No   Drug use: No   Sexual activity: Not on file  Other Topics Concern   Not on file  Social History Narrative   Not on file   Social Determinants of Health   Financial Resource Strain: Not on file  Food Insecurity: Not on file  Transportation Needs: Not on file  Physical Activity: Not on file  Stress: Not on file  Social Connections: Not on file   No family history on file. Allergies  Allergen Reactions   Penicillins Rash    Was told not to take it per Dr   Current Outpatient Medications  Medication Sig Dispense Refill   acetaminophen (TYLENOL) 500 MG tablet Take 1 tablet (500 mg total) by mouth every 8 (eight) hours for 10 days. 30 tablet 0   aspirin EC 325 MG tablet Take 1 tablet (325 mg total) by mouth  daily. 30 tablet 0   ibuprofen (ADVIL) 800 MG tablet Take 1 tablet (800 mg total) by mouth every 8 (eight) hours for 10 days. Please take with food, please alternate with acetaminophen 30 tablet 0   oxyCODONE (OXY IR/ROXICODONE) 5 MG immediate release tablet Take 1 tablet (5 mg total) by mouth every 4 (four) hours as needed (severe pain). 20 tablet 0   Ascorbic Acid Buffered (BUFFERED VITAMIN C) 1000 MG CAPS Take 1,000 mg by mouth in the morning and at bedtime.     aspirin EC 81 MG tablet Take 81 mg by mouth at bedtime. Swallow whole.     atorvastatin (LIPITOR) 20 MG tablet Take 20 mg by mouth in the morning.     baclofen (LIORESAL) 10 MG tablet Take 10 mg by mouth 3 (three) times daily.     Cholecalciferol (VITAMIN D3 PO) Take 2,500 Units by mouth every Monday, Tuesday, Wednesday, Thursday, and Friday.     empagliflozin (JARDIANCE) 25 MG TABS tablet Take 25 mg by mouth in the morning.     gabapentin (NEURONTIN) 300 MG capsule Take 300 mg by mouth 3 (three) times daily.     Glucosamine HCl 1000 MG TABS Take 1,000 mg by mouth 2 (two) times daily.     liothyronine (CYTOMEL) 5 MCG tablet Take 10 mcg by mouth daily before breakfast.     lisinopril (PRINIVIL,ZESTRIL) 20 MG tablet Take 20 mg by mouth in the morning.     Magnesium 500 MG TABS Take 500 mg by mouth See admin instructions. Take 1 tablet by mouth on Sundays through Fridays, skip Saturdays     metFORMIN (GLUCOPHAGE) 500 MG tablet Take 500 mg by mouth 2 (two) times daily.     Methylcobalamin 5000 MCG TBDP Take 5,000 mcg by mouth daily with breakfast. NOW Supplements, Methyl B-12 (Methylcobalamin)     Methylsulfonylmethane (MSM) 1000 MG TABS Take 1,000 mg by mouth 2 (two) times daily.     Multiple Vitamin (MULTIVITAMIN WITH MINERALS) TABS tablet Take 1 tablet by mouth every evening.     Omega-3 Fatty Acids (FISH OIL PO) Take 15 mLs by mouth in the morning and at bedtime. tablespoon     Probiotic Product (PROBIOTIC PO) Take 1 capsule by mouth in  the morning and at bedtime. Now Foods Gr8-Dophilus     Semaglutide (OZEMPIC, 0.25 OR 0.5 MG/DOSE, ) Inject 0.5 mg into the skin every Wednesday.     spironolactone (ALDACTONE) 25 MG tablet Take 25 mg by mouth in the morning.     ZINC GLUCONATE PO  Take 30 mg by mouth 2 (two) times a week. On Mondays & Fridays ONLY     No current facility-administered medications for this visit.   No results found.  Review of Systems:   A ROS was performed including pertinent positives and negatives as documented in the HPI.  Physical Exam :   Constitutional: NAD and appears stated age Neurological: Alert and oriented Psych: Appropriate affect and cooperative There were no vitals taken for this visit.   Comprehensive Musculoskeletal Exam:    Musculoskeletal Exam    Inspection Right Left  Skin No atrophy or winging No atrophy or winging  Palpation    Tenderness None Glenohumeral as well as lateral deltoid  Range of Motion    Flexion (passive) 170 100  Flexion (active) 170 100  Abduction 170 90  ER at the side 70 -10  Can reach behind back to T12 Back pocket  Strength     Full 4 out of 5 with supra and infraspinatus as well as a positive belly press  Special Tests    Pseudoparalytic No No  Neurologic    Fires PIN, radial, median, ulnar, musculocutaneous, axillary, suprascapular, long thoracic, and spinal accessory innervated muscles. No abnormal sensibility  Vascular/Lymphatic    Radial Pulse 2+ 2+  Cervical Exam    Patient has symmetric cervical range of motion with negative Spurling's test.  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  This is massive.  The muscle belly has a significantly positive tangent sign on sagittal T1 view of the supraspinatus.  There is significant fatty  infiltration of this muscle.  There is a superior border subscapularis tear with biceps subluxation.  There is a significant amount of chondral loss particularly involving the superior portion of the glenoid.  I personally reviewed and interpreted the radiographs.   Assessment:   76 y.o. female right-hand-dominant presents with left shoulder pain consistent with rotator cuff arthropathy.  At today's visit I did discuss that I do believe that many of her issues are a result of the injury that happened in January where she landed up against a wall.  Unfortunately she does have fairly significant rotator cuff atrophy at this time which I think would lend itself last to her repair.  We did discuss additional treatment options including additional injection versus additional physical therapy for strengthening of the left shoulder although I am again less confident in this option given her full-thickness massive rotator cuff tear.  At this time given the fact that she has trialed multiple nonoperative treatments, we did have a discussion regarding possible reverse shoulder arthroplasty.  I believe that this would give her the most durable option to have a successful longer-term outcome.  I did describe that while a repair would be possible I think that given her age and comorbidities along with MRI findings this would be unlikely to heal.  We discussed that there are several complications that can come with reverse shoulder arthroplasty.  After discussion of continued nonoperative management versus shoulder arthroplasty she is elected for a left shoulder reverse shoulder arthroplasty.  Plan :    -Plan for left shoulder reverse shoulder arthroplasty    After a lengthy discussion of treatment options, including risks, benefits, alternatives, complications of surgical and nonsurgical conservative options, the patient elected surgical  repair.   The patient  is aware of the material risks  and complications  including, but not limited to injury to adjacent structures, neurovascular injury, infection, numbness, bleeding, implant failure, thermal burns, stiffness, persistent pain, failure to heal, disease transmission from allograft, need for further surgery, dislocation, anesthetic risks, blood clots, risks of death,and others. The probabilities of surgical success and failure discussed with patient given their particular co-morbidities.The time and nature of expected rehabilitation and recovery was discussed.The patient's questions were all answered preoperatively.  No barriers to understanding were noted. I explained the natural history of the disease process and Rx rationale.  I explained to the patient what I considered to be reasonable expectations given their personal situation.  The final treatment plan was arrived at through a shared patient decision making process model.      I personally saw and evaluated the patient, and participated in the management and treatment plan.  Huel Cote, MD Attending Physician, Orthopedic Surgery  This document was dictated using Dragon voice recognition software. A reasonable attempt at proof reading has been made to minimize errors.

## 2021-08-08 ENCOUNTER — Ambulatory Visit (HOSPITAL_BASED_OUTPATIENT_CLINIC_OR_DEPARTMENT_OTHER)
Admission: RE | Admit: 2021-08-08 | Discharge: 2021-08-08 | Disposition: A | Discharge status: Home / Self Care | Payer: PRIVATE HEALTH INSURANCE | Source: Ambulatory Visit | Attending: Orthopaedic Surgery | Admitting: Orthopaedic Surgery

## 2021-08-08 ENCOUNTER — Other Ambulatory Visit (HOSPITAL_BASED_OUTPATIENT_CLINIC_OR_DEPARTMENT_OTHER): Payer: Self-pay | Admitting: Orthopaedic Surgery

## 2021-08-08 ENCOUNTER — Other Ambulatory Visit (HOSPITAL_BASED_OUTPATIENT_CLINIC_OR_DEPARTMENT_OTHER): Payer: PRIVATE HEALTH INSURANCE

## 2021-08-08 DIAGNOSIS — R52 Pain, unspecified: Secondary | ICD-10-CM

## 2021-08-15 ENCOUNTER — Other Ambulatory Visit (HOSPITAL_BASED_OUTPATIENT_CLINIC_OR_DEPARTMENT_OTHER): Payer: Self-pay | Admitting: Orthopaedic Surgery

## 2021-08-15 DIAGNOSIS — M12812 Other specific arthropathies, not elsewhere classified, left shoulder: Secondary | ICD-10-CM

## 2021-08-16 ENCOUNTER — Telehealth (HOSPITAL_BASED_OUTPATIENT_CLINIC_OR_DEPARTMENT_OTHER): Payer: Self-pay | Admitting: Orthopaedic Surgery

## 2021-08-16 NOTE — Telephone Encounter (Signed)
MyChart message about CT scan

## 2021-08-21 ENCOUNTER — Telehealth: Payer: Self-pay | Admitting: Orthopaedic Surgery

## 2021-08-21 NOTE — Telephone Encounter (Signed)
Spoke to patient directly and informed her Dr. Steward Drone did order the CT scan to template the arthroplasty and it is preferred to be done at a [facility] facility for ease of access and formatting. No further questions were asked

## 2021-08-21 NOTE — Telephone Encounter (Signed)
Patient left a message stating she received a call to schedule a CT scan, and wanted to know if Dr. Steward Drone is ordering this? Also, patient lives in [place] and prefers to go there for the scan if possible. Please call to advise.

## 2021-08-23 NOTE — Telephone Encounter (Signed)
Patient would like to know if she gets her CT scans done on 06/27 and would like to know if everything will be ready by surgery on the 07/03 please advise she really just needs comforting and reassurance.

## 2021-08-23 NOTE — Telephone Encounter (Signed)
Spoke to patient directly, confirmed CT scan is okay for 06/27 with surgery 07/03. No further questions were asked

## 2021-08-28 ENCOUNTER — Telehealth (HOSPITAL_BASED_OUTPATIENT_CLINIC_OR_DEPARTMENT_OTHER): Payer: Self-pay | Admitting: Orthopaedic Surgery

## 2021-08-28 NOTE — Telephone Encounter (Signed)
Post op questions 

## 2021-08-29 NOTE — Pre-Procedure Instructions (Signed)
Surgical Instructions    Your procedure is scheduled on Monday 09/09/21.   Report to [facility] Main Entrance "A" at 10:00 A.M., then check in with the Admitting office.  Call this number if you have problems the morning of surgery:  [phone]   If you have any questions prior to your surgery date call [phone]: Open Monday-Friday 8am-4pm    Remember:  Do not eat after midnight the night before your surgery  You may drink clear liquids until 09:00 A.M. the morning of your surgery.   Clear liquids allowed are: Water, Non-Citrus Juices (without pulp), Carbonated Beverages, Clear Tea, Black Coffee ONLY (NO MILK, CREAM OR POWDERED CREAMER of any kind), and Gatorade    Take these medicines the morning of surgery with A SIP OF WATER:   atorvastatin (LIPITOR)   gabapentin (NEURONTIN)  nitrofurantoin, macrocrystal-monohydrate, (MACROBID)   Take these medicines if needed:   oxyCODONE (OXY IR/ROXICODONE)   As of today, STOP taking any Aspirin (unless otherwise instructed by your surgeon) Aleve, Naproxen, Ibuprofen, Motrin, Advil, Goody's, BC's, all herbal medications, fish oil, and all vitamins.  WHAT DO I DO ABOUT MY DIABETES MEDICATION?   Do not take oral diabetes medicines (pills) the morning of surgery.  DO NOT TAKE dapagliflozin propanediol (FARXIGA) the day before surgery or the morning of surgery.   DO NOT TAKE metFORMIN (GLUCOPHAGE) the morning of surgery.   DO NOT TAKE  Semaglutide (OZEMPIC) the day of surgery.   The day of surgery, do not take other diabetes injectables, including Byetta (exenatide), Bydureon (exenatide ER), Victoza (liraglutide), or Trulicity (dulaglutide).  HOW TO MANAGE YOUR DIABETES BEFORE AND AFTER SURGERY  Why is it important to control my blood sugar before and after surgery? Improving blood sugar levels before and after surgery helps healing and can limit problems. A way of improving blood sugar control is eating a healthy diet by:  Eating  less sugar and carbohydrates  Increasing activity/exercise  Talking with your doctor about reaching your blood sugar goals High blood sugars (greater than 180 mg/dL) can raise your risk of infections and slow your recovery, so you will need to focus on controlling your diabetes during the weeks before surgery. Make sure that the doctor who takes care of your diabetes knows about your planned surgery including the date and location.  How do I manage my blood sugar before surgery? Check your blood sugar at least 4 times a day, starting 2 days before surgery, to make sure that the level is not too high or low.  Check your blood sugar the morning of your surgery when you wake up and every 2 hours until you get to the Short Stay unit.  If your blood sugar is less than 70 mg/dL, you will need to treat for low blood sugar: Do not take insulin. Treat a low blood sugar (less than 70 mg/dL) with  cup of clear juice (cranberry or apple), 4 glucose tablets, OR glucose gel. Recheck blood sugar in 15 minutes after treatment (to make sure it is greater than 70 mg/dL). If your blood sugar is not greater than 70 mg/dL on recheck, call [phone] for further instructions. Report your blood sugar to the short stay nurse when you get to Short Stay.  If you are admitted to the hospital after surgery: Your blood sugar will be checked by the staff and you will probably be given insulin after surgery (instead of oral diabetes medicines) to make sure you have good blood sugar levels. The  goal for blood sugar control after surgery is 80-180 mg/dL.   [facility]- Preparing for Total Shoulder Arthroplasty  Before surgery, you can play an important role. Because skin is not sterile, your skin needs to be as free of germs as possible. You can reduce the number of germs on your skin by using the following products.   Benzoyl Peroxide Gel  o Reduces the number of germs present on the skin  o Applied twice a day to  shoulder area starting two days before surgery   Chlorhexidine Gluconate (CHG) Soap (instructions listed above on how to wash with CHG Soap)  o An antiseptic cleaner that kills germs and bonds with the skin to continue killing germs even after washing  o Used for showering the night before surgery and morning of surgery   ==================================================================  Please follow these instructions carefully:  BENZOYL PEROXIDE 5% GEL  Please do not use if you have an allergy to benzoyl peroxide. If your skin becomes reddened/irritated stop using the benzoyl peroxide.  Starting two days before surgery, apply as follows:  1. Apply benzoyl peroxide in the morning and at night. Apply after taking a shower. If you are not taking a shower clean entire shoulder front, back, and side along with the armpit with a clean wet washcloth.  2. Place a quarter-sized dollop on your SHOULDER and rub in thoroughly, making sure to cover the front, back, and side of your shoulder, along with the armpit.   2 Days prior to Surgery First Dose on 09/07/21 Saturday Morning Second Dose on 09/07/21 Saturday Night  Day Before Surgery First Dose on Sunday 09/08/21 Morning Night before surgery wash (entire body except face and private areas) with CHG Soap THEN Second Dose on Sunday 09/08/21 Night   Morning of Surgery  wash BODY AGAIN with CHG Soap   4. Do NOT apply benzoyl peroxide gel on the day of surgery   [facility]- Preparing For Surgery  Before surgery, you can play an important role. Because skin is not sterile, your skin needs to be as free of germs as possible. You can reduce the number of germs on your skin by washing with CHG (chlorahexidine gluconate) Soap before surgery.  CHG is an antiseptic cleaner which kills germs and bonds with the skin to continue killing germs even after washing.     Please do not use if you have an allergy to CHG or antibacterial soaps. If your skin  becomes reddened/irritated stop using the CHG.  Do not shave (including legs and underarms) for at least 48 hours prior to first CHG shower. It is OK to shave your face.  Please follow these instructions carefully.     Shower the NIGHT BEFORE SURGERY and the MORNING OF SURGERY with CHG Soap.   If you chose to wash your hair, wash your hair first as usual with your normal shampoo. After you shampoo, rinse your hair and body thoroughly to remove the shampoo.  Then [employer] and genitals (private parts) with your normal soap and rinse thoroughly to remove soap.  After that Use CHG Soap as you would any other liquid soap. You can apply CHG directly to the skin and wash gently with a scrungie or a clean washcloth.   Apply the CHG Soap to your body ONLY FROM THE NECK DOWN.  Do not use on open wounds or open sores. Avoid contact with your eyes, ears, mouth and genitals (private parts). Wash Face and genitals (private parts)  with your normal soap.   Wash thoroughly, paying special attention to the area where your surgery will be performed.  Thoroughly rinse your body with warm water from the neck down.  DO NOT shower/wash with your normal soap after using and rinsing off the CHG Soap.  Pat yourself dry with a CLEAN TOWEL.  8. Apply the Benzoyl Peroxide only the night before surgery.  Do Not use it the morning of surgery.  Wear CLEAN PAJAMAS to bed the night before surgery  Place CLEAN SHEETS on your bed the night before your surgery  DO NOT SLEEP WITH PETS.   Day of Surgery: Take a shower with CHG soap. Wear Clean/Comfortable clothing the morning of surgery Do not apply any deodorants/lotions.   Remember to brush your teeth WITH YOUR REGULAR TOOTHPASTE.   Please read over the following fact sheets that you were given.   Do not wear jewelry or makeup Do not wear lotions, powders, perfumes/colognes, or deodorant. Do not shave 48 hours prior to surgery.  Men may shave face and neck. Do  not bring valuables to the hospital. Do not wear nail polish, gel polish, artificial nails, or any other type of covering on natural nails (fingers and toes) If you have artificial nails or gel coating that need to be removed by a nail salon, please have this removed prior to surgery. Artificial nails or gel coating may interfere with anesthesia's ability to adequately monitor your vital signs.  [facility] is not responsible for any belongings or valuables. .   Do NOT Smoke (Tobacco/Vaping)  24 hours prior to your procedure  If you use a CPAP at night, you may bring your mask for your overnight stay.   Contacts, glasses, hearing aids, dentures or partials may not be worn into surgery, please bring cases for these belongings   For patients admitted to the hospital, discharge time will be determined by your treatment team.   Patients discharged the day of surgery will not be allowed to drive home, and someone needs to stay with them for 24 hours.   SURGICAL WAITING ROOM VISITATION Patients having surgery or a procedure in a hospital may have two support people. Children under the age of 56 must have an adult with them who is not the patient. They may stay in the waiting area during the procedure and may switch out with other visitors. If the patient needs to stay at the hospital during part of their recovery, the visitor guidelines for inpatient rooms apply.  Please refer to the [facility] website for the visitor guidelines for Inpatients (after your surgery is over and you are in a regular room).    Special instructions:    Oral Hygiene is also important to reduce your risk of infection.  Remember - BRUSH YOUR TEETH THE MORNING OF SURGERY WITH YOUR REGULAR TOOTHPASTE   If you received a COVID test during your pre-op visit, it is requested that you wear a mask when out in public, stay away from anyone that may not be feeling well, and notify your surgeon if you develop symptoms. If you  have been in contact with anyone that has tested positive in the last 10 days, please notify your surgeon.    Please read over the following fact sheets that you were given.

## 2021-08-30 ENCOUNTER — Encounter (HOSPITAL_COMMUNITY): Payer: Self-pay

## 2021-08-30 ENCOUNTER — Encounter (HOSPITAL_COMMUNITY)
Admission: RE | Admit: 2021-08-30 | Discharge: 2021-08-30 | Disposition: A | Discharge status: Home / Self Care | Payer: Medicare Other | Source: Ambulatory Visit | Attending: Orthopaedic Surgery | Admitting: Orthopaedic Surgery

## 2021-08-30 ENCOUNTER — Other Ambulatory Visit: Payer: Self-pay

## 2021-08-30 VITALS — BP 111/61 | HR 66 | Temp 97.6°F | Resp 17 | Ht 64.0 in | Wt 196.3 lb

## 2021-08-30 DIAGNOSIS — Z01812 Encounter for preprocedural laboratory examination: Secondary | ICD-10-CM | POA: Diagnosis not present

## 2021-08-30 DIAGNOSIS — Z01818 Encounter for other preprocedural examination: Secondary | ICD-10-CM

## 2021-08-30 DIAGNOSIS — E119 Type 2 diabetes mellitus without complications: Secondary | ICD-10-CM | POA: Diagnosis not present

## 2021-08-30 LAB — BASIC METABOLIC PANEL
Anion gap: 11 (ref 5–15)
BUN: 27 mg/dL — ABNORMAL HIGH (ref 8–23)
CO2: 26 mmol/L (ref 22–32)
Calcium: 9.8 mg/dL (ref 8.9–10.3)
Chloride: 99 mmol/L (ref 98–111)
Creatinine, Ser: 0.74 mg/dL (ref 0.44–1.00)
GFR, Estimated: >60 mL/min (ref >60)
Glucose, Bld: 114 mg/dL — ABNORMAL HIGH (ref 70–99)
Potassium: 4.9 mmol/L (ref 3.5–5.1)
Sodium: 136 mmol/L (ref 135–145)

## 2021-08-30 LAB — SURGICAL PCR SCREEN
MRSA, PCR: NEGATIVE
Staphylococcus aureus: NEGATIVE

## 2021-08-30 LAB — GLUCOSE, CAPILLARY: Glucose-Capillary: 114 mg/dL — ABNORMAL HIGH (ref 70–99)

## 2021-08-30 LAB — CBC
HCT: 45.6 % (ref 36.0–46.0)
Hemoglobin: 15.2 g/dL — ABNORMAL HIGH (ref 12.0–15.0)
MCH: 31.1 pg (ref 26.0–34.0)
MCHC: 33.3 g/dL (ref 30.0–36.0)
MCV: 93.3 fL (ref 80.0–100.0)
Platelets: 270 10*3/uL (ref 150–400)
RBC: 4.89 MIL/uL (ref 3.87–5.11)
RDW: 12.6 % (ref 11.5–15.5)
WBC: 7.5 10*3/uL (ref 4.0–10.5)
nRBC: 0 % (ref 0.0–0.2)

## 2021-09-03 ENCOUNTER — Ambulatory Visit
Admission: RE | Admit: 2021-09-03 | Discharge: 2021-09-03 | Disposition: A | Discharge status: Home / Self Care | Payer: Medicare Other | Source: Ambulatory Visit | Attending: Orthopaedic Surgery | Admitting: Orthopaedic Surgery

## 2021-09-03 DIAGNOSIS — M12812 Other specific arthropathies, not elsewhere classified, left shoulder: Secondary | ICD-10-CM

## 2021-09-09 ENCOUNTER — Encounter (HOSPITAL_COMMUNITY): Payer: Self-pay | Admitting: Orthopaedic Surgery

## 2021-09-09 ENCOUNTER — Ambulatory Visit (HOSPITAL_BASED_OUTPATIENT_CLINIC_OR_DEPARTMENT_OTHER): Payer: Self-pay | Admitting: Orthopaedic Surgery

## 2021-09-09 ENCOUNTER — Other Ambulatory Visit: Payer: Self-pay

## 2021-09-09 ENCOUNTER — Encounter (HOSPITAL_COMMUNITY)
Admission: RE | Disposition: A | Discharge status: Home / Self Care | Payer: Self-pay | Source: Home / Self Care | Attending: Orthopaedic Surgery

## 2021-09-09 ENCOUNTER — Other Ambulatory Visit (HOSPITAL_BASED_OUTPATIENT_CLINIC_OR_DEPARTMENT_OTHER): Payer: Self-pay | Admitting: Orthopaedic Surgery

## 2021-09-09 ENCOUNTER — Ambulatory Visit (HOSPITAL_COMMUNITY): Payer: Medicare Other | Admitting: Physician Assistant

## 2021-09-09 ENCOUNTER — Ambulatory Visit (HOSPITAL_COMMUNITY): Payer: Medicare Other

## 2021-09-09 ENCOUNTER — Ambulatory Visit (HOSPITAL_BASED_OUTPATIENT_CLINIC_OR_DEPARTMENT_OTHER): Payer: Medicare Other | Admitting: Anesthesiology

## 2021-09-09 ENCOUNTER — Ambulatory Visit (HOSPITAL_COMMUNITY)
Admission: RE | Admit: 2021-09-09 | Discharge: 2021-09-09 | Disposition: A | Discharge status: Home / Self Care | Payer: Medicare Other | Attending: Orthopaedic Surgery | Admitting: Orthopaedic Surgery

## 2021-09-09 DIAGNOSIS — Z6834 Body mass index (BMI) 34.0-34.9, adult: Secondary | ICD-10-CM | POA: Diagnosis not present

## 2021-09-09 DIAGNOSIS — E119 Type 2 diabetes mellitus without complications: Secondary | ICD-10-CM | POA: Insufficient documentation

## 2021-09-09 DIAGNOSIS — I10 Essential (primary) hypertension: Secondary | ICD-10-CM | POA: Diagnosis not present

## 2021-09-09 DIAGNOSIS — M12812 Other specific arthropathies, not elsewhere classified, left shoulder: Secondary | ICD-10-CM

## 2021-09-09 DIAGNOSIS — M7522 Bicipital tendinitis, left shoulder: Secondary | ICD-10-CM

## 2021-09-09 DIAGNOSIS — G4730 Sleep apnea, unspecified: Secondary | ICD-10-CM | POA: Insufficient documentation

## 2021-09-09 DIAGNOSIS — K449 Diaphragmatic hernia without obstruction or gangrene: Secondary | ICD-10-CM | POA: Insufficient documentation

## 2021-09-09 DIAGNOSIS — E785 Hyperlipidemia, unspecified: Secondary | ICD-10-CM | POA: Insufficient documentation

## 2021-09-09 DIAGNOSIS — Z7984 Long term (current) use of oral hypoglycemic drugs: Secondary | ICD-10-CM | POA: Diagnosis not present

## 2021-09-09 DIAGNOSIS — E669 Obesity, unspecified: Secondary | ICD-10-CM | POA: Diagnosis not present

## 2021-09-09 DIAGNOSIS — M19012 Primary osteoarthritis, left shoulder: Secondary | ICD-10-CM | POA: Diagnosis not present

## 2021-09-09 DIAGNOSIS — Z7985 Long-term (current) use of injectable non-insulin antidiabetic drugs: Secondary | ICD-10-CM | POA: Diagnosis not present

## 2021-09-09 HISTORY — PX: REVERSE SHOULDER ARTHROPLASTY: SHX5054

## 2021-09-09 LAB — GLUCOSE, CAPILLARY
Glucose-Capillary: 129 mg/dL — ABNORMAL HIGH (ref 70–99)
Glucose-Capillary: 130 mg/dL — ABNORMAL HIGH (ref 70–99)
Glucose-Capillary: 140 mg/dL — ABNORMAL HIGH (ref 70–99)

## 2021-09-09 SURGERY — ARTHROPLASTY, SHOULDER, TOTAL, REVERSE
Anesthesia: General | Site: Shoulder | Laterality: Left

## 2021-09-09 MED ORDER — DEXAMETHASONE SODIUM PHOSPHATE 10 MG/ML IJ SOLN
INTRAMUSCULAR | Status: DC | PRN
  Administered 2021-09-09: 4 mg via INTRAVENOUS

## 2021-09-09 MED ORDER — ONDANSETRON HCL 4 MG/2ML IJ SOLN
INTRAMUSCULAR | Status: DC | PRN
  Administered 2021-09-09: 4 mg via INTRAVENOUS

## 2021-09-09 MED ORDER — FENTANYL CITRATE (PF) 100 MCG/2ML IJ SOLN: 50.0000 ug | Freq: Once | INTRAMUSCULAR | Status: AC

## 2021-09-09 MED ORDER — ROCURONIUM BROMIDE 10 MG/ML (PF) SYRINGE
PREFILLED_SYRINGE | INTRAVENOUS | Status: AC
  Filled 2021-09-09: qty 10

## 2021-09-09 MED ORDER — FENTANYL CITRATE (PF) 100 MCG/2ML IJ SOLN
INTRAMUSCULAR | Status: AC
  Administered 2021-09-09: 50 ug via INTRAVENOUS
  Filled 2021-09-09: qty 2

## 2021-09-09 MED ORDER — PROPOFOL 10 MG/ML IV BOLUS
INTRAVENOUS | Status: AC
Start: 2021-09-09 — End: ?
  Filled 2021-09-09: qty 20

## 2021-09-09 MED ORDER — EPHEDRINE SULFATE (PRESSORS) 50 MG/ML IJ SOLN
INTRAMUSCULAR | Status: DC | PRN
  Administered 2021-09-09 (×3): 5 mg via INTRAVENOUS

## 2021-09-09 MED ORDER — TRANEXAMIC ACID-NACL 1000-0.7 MG/100ML-% IV SOLN
1000.0000 mg | INTRAVENOUS | Status: AC
  Administered 2021-09-09: 1000 mg via INTRAVENOUS

## 2021-09-09 MED ORDER — BUPIVACAINE HCL (PF) 0.5 % IJ SOLN
INTRAMUSCULAR | Status: DC | PRN
  Administered 2021-09-09: 15 mL via PERINEURAL

## 2021-09-09 MED ORDER — ROCURONIUM BROMIDE 100 MG/10ML IV SOLN
INTRAVENOUS | Status: DC | PRN
  Administered 2021-09-09: 70 mg via INTRAVENOUS

## 2021-09-09 MED ORDER — TRANEXAMIC ACID-NACL 1000-0.7 MG/100ML-% IV SOLN
INTRAVENOUS | Status: AC
  Filled 2021-09-09: qty 100

## 2021-09-09 MED ORDER — LACTATED RINGERS IV SOLN: INTRAVENOUS | Status: DC

## 2021-09-09 MED ORDER — LIDOCAINE 2% (20 MG/ML) 5 ML SYRINGE
INTRAMUSCULAR | Status: AC
  Filled 2021-09-09: qty 5

## 2021-09-09 MED ORDER — OXYCODONE HCL 5 MG PO TABS: 5.0000 mg | ORAL_TABLET | Freq: Once | ORAL | Status: DC | PRN

## 2021-09-09 MED ORDER — BUPIVACAINE LIPOSOME 1.3 % IJ SUSP: INTRAMUSCULAR | Status: DC | PRN

## 2021-09-09 MED ORDER — CEFAZOLIN SODIUM-DEXTROSE 2-4 GM/100ML-% IV SOLN
INTRAVENOUS | Status: AC
  Filled 2021-09-09: qty 100

## 2021-09-09 MED ORDER — LIDOCAINE 2% (20 MG/ML) 5 ML SYRINGE
INTRAMUSCULAR | Status: DC | PRN
  Administered 2021-09-09: 60 mg via INTRAVENOUS

## 2021-09-09 MED ORDER — 0.9 % SODIUM CHLORIDE (POUR BTL) OPTIME
TOPICAL | Status: DC | PRN
  Administered 2021-09-09: 1000 mL

## 2021-09-09 MED ORDER — SUGAMMADEX SODIUM 200 MG/2ML IV SOLN
INTRAVENOUS | Status: DC | PRN
  Administered 2021-09-09: 183.2 mg via INTRAVENOUS

## 2021-09-09 MED ORDER — CHLORHEXIDINE GLUCONATE 0.12 % MT SOLN: 15.0000 mL | Freq: Once | OROMUCOSAL | Status: AC

## 2021-09-09 MED ORDER — ACETAMINOPHEN 500 MG PO TABS: 1000.0000 mg | ORAL_TABLET | Freq: Once | ORAL | Status: AC

## 2021-09-09 MED ORDER — MIDAZOLAM HCL 2 MG/2ML IJ SOLN
INTRAMUSCULAR | Status: AC
  Filled 2021-09-09: qty 2

## 2021-09-09 MED ORDER — PROPOFOL 10 MG/ML IV BOLUS
INTRAVENOUS | Status: DC | PRN
  Administered 2021-09-09: 20 mg via INTRAVENOUS
  Administered 2021-09-09: 120 mg via INTRAVENOUS
  Administered 2021-09-09: 30 mg via INTRAVENOUS

## 2021-09-09 MED ORDER — SODIUM CHLORIDE 0.9 % IR SOLN
Status: DC | PRN
  Administered 2021-09-09: 1000 mL

## 2021-09-09 MED ORDER — VANCOMYCIN HCL 1000 MG IV SOLR
INTRAVENOUS | Status: DC | PRN
  Administered 2021-09-09: 1000 mg via TOPICAL

## 2021-09-09 MED ORDER — FENTANYL CITRATE (PF) 250 MCG/5ML IJ SOLN
INTRAMUSCULAR | Status: AC
  Filled 2021-09-09: qty 5

## 2021-09-09 MED ORDER — CEFAZOLIN SODIUM-DEXTROSE 2-4 GM/100ML-% IV SOLN
2.0000 g | INTRAVENOUS | Status: AC
  Administered 2021-09-09: 2 g via INTRAVENOUS

## 2021-09-09 MED ORDER — ONDANSETRON HCL 4 MG/2ML IJ SOLN
INTRAMUSCULAR | Status: AC
  Filled 2021-09-09: qty 2

## 2021-09-09 MED ORDER — GABAPENTIN 300 MG PO CAPS: 300.0000 mg | ORAL_CAPSULE | Freq: Once | ORAL | Status: DC

## 2021-09-09 MED ORDER — EPHEDRINE 5 MG/ML INJ
INTRAVENOUS | Status: AC
  Filled 2021-09-09: qty 5

## 2021-09-09 MED ORDER — FENTANYL CITRATE (PF) 100 MCG/2ML IJ SOLN: 25.0000 ug | INTRAMUSCULAR | Status: DC | PRN

## 2021-09-09 MED ORDER — OXYCODONE HCL 5 MG/5ML PO SOLN: 5.0000 mg | Freq: Once | ORAL | Status: DC | PRN

## 2021-09-09 MED ORDER — VANCOMYCIN HCL 1000 MG IV SOLR
INTRAVENOUS | Status: AC
  Filled 2021-09-09: qty 20

## 2021-09-09 MED ORDER — ACETAMINOPHEN 500 MG PO TABS
ORAL_TABLET | ORAL | Status: AC
  Administered 2021-09-09: 1000 mg via ORAL
  Filled 2021-09-09: qty 2

## 2021-09-09 MED ORDER — CHLORHEXIDINE GLUCONATE 0.12 % MT SOLN
OROMUCOSAL | Status: AC
  Administered 2021-09-09: 15 mL via OROMUCOSAL
  Filled 2021-09-09: qty 15

## 2021-09-09 MED ORDER — BUPIVACAINE LIPOSOME 1.3 % IJ SUSP
INTRAMUSCULAR | Status: DC | PRN
  Administered 2021-09-09: 10 mL via PERINEURAL

## 2021-09-09 MED ORDER — ONDANSETRON HCL 4 MG/2ML IJ SOLN: 4.0000 mg | Freq: Once | INTRAMUSCULAR | Status: DC | PRN

## 2021-09-09 MED ORDER — FENTANYL CITRATE (PF) 100 MCG/2ML IJ SOLN
INTRAMUSCULAR | Status: DC | PRN
  Administered 2021-09-09 (×3): 50 ug via INTRAVENOUS

## 2021-09-09 MED ORDER — INSULIN ASPART 100 UNIT/ML IJ SOLN: 0.0000 [IU] | INTRAMUSCULAR | Status: DC | PRN

## 2021-09-09 MED ORDER — ORAL CARE MOUTH RINSE: 15.0000 mL | Freq: Once | OROMUCOSAL | Status: AC

## 2021-09-09 SURGICAL SUPPLY — 83 items
BAG COUNTER SPONGE SURGICOUNT (BAG) ×2 IMPLANT
BASEPLATE GLENOSPHERE 25 STD (Miscellaneous) ×1 IMPLANT
BETADINE 5% OPHTHALMIC (OPHTHALMIC) ×1 IMPLANT
BIT DRILL 3.2 PERIPHERAL SCREW (BIT) ×1 IMPLANT
BLADE SAGITTAL (BLADE) ×1
BLADE SAW SAG 29X58X.64 (BLADE) ×2 IMPLANT
BLADE SAW THK.89X75X18XSGTL (BLADE) IMPLANT
BLADE SURG 15 STRL LF DISP TIS (BLADE) ×1 IMPLANT
BLADE SURG 15 STRL SS (BLADE) ×1
CHLORAPREP W/TINT 26 (MISCELLANEOUS) ×2 IMPLANT
COOLER ICEMAN CLASSIC (MISCELLANEOUS) ×2 IMPLANT
COVER SURGICAL LIGHT HANDLE (MISCELLANEOUS) ×2 IMPLANT
DERMABOND ADVANCED (GAUZE/BANDAGES/DRESSINGS) ×1
DERMABOND ADVANCED .7 DNX12 (GAUZE/BANDAGES/DRESSINGS) IMPLANT
DRAPE IMP U-DRAPE 54X76 (DRAPES) ×2 IMPLANT
DRAPE INCISE IOBAN 66X45 STRL (DRAPES) ×3 IMPLANT
DRAPE ORTHO SPLIT 77X108 STRL (DRAPES)
DRAPE STERI 35X30 U-POUCH (DRAPES) ×2 IMPLANT
DRAPE SURG ORHT 6 SPLT 77X108 (DRAPES) ×1 IMPLANT
DRAPE U-SHAPE 47X51 STRL (DRAPES) ×4 IMPLANT
DRSG AQUACEL AG ADV 3.5X10 (GAUZE/BANDAGES/DRESSINGS) ×2 IMPLANT
ELECT BLADE 4.0 EZ CLEAN MEGAD (MISCELLANEOUS) ×2
ELECT REM PT RETURN 9FT ADLT (ELECTROSURGICAL) ×2
ELECTRODE BLDE 4.0 EZ CLN MEGD (MISCELLANEOUS) ×1 IMPLANT
ELECTRODE REM PT RTRN 9FT ADLT (ELECTROSURGICAL) ×1 IMPLANT
GLENOSPHERE REV SHOULDER 36 (Joint) ×1 IMPLANT
GLOVE BIOGEL PI IND STRL 7.0 (GLOVE) ×1 IMPLANT
GLOVE BIOGEL PI IND STRL 8 (GLOVE) ×1 IMPLANT
GLOVE BIOGEL PI INDICATOR 7.0 (GLOVE) ×1
GLOVE BIOGEL PI INDICATOR 8 (GLOVE) ×1
GLOVE ECLIPSE 7.0 STRL STRAW (GLOVE) ×2 IMPLANT
GLOVE ECLIPSE 8.0 STRL XLNG CF (GLOVE) ×2 IMPLANT
GLOVE SURG ENC MOIS LTX SZ6 (GLOVE) ×6 IMPLANT
GLOVE SURG SYN 7.5  E (GLOVE) ×3
GLOVE SURG SYN 7.5 E (GLOVE) ×3 IMPLANT
GLOVE SURG SYN 7.5 PF PI (GLOVE) ×3 IMPLANT
GLOVE SURG UNDER POLY LF SZ6.5 (GLOVE) ×4 IMPLANT
GOWN STRL REUS W/ TWL LRG LVL3 (GOWN DISPOSABLE) ×2 IMPLANT
GOWN STRL REUS W/TWL LRG LVL3 (GOWN DISPOSABLE) ×2
GUIDE PIN 3X75 SHOULDER (PIN) ×4
GUIDEWIRE GLENOID 2.5X220 (WIRE) ×1 IMPLANT
HANDPIECE INTERPULSE COAX TIP (DISPOSABLE) ×1
HUMERAL SYS INSERT SZ3 4X36 (Orthopedic Implant) ×2 IMPLANT
KIT BASIN OR (CUSTOM PROCEDURE TRAY) ×2 IMPLANT
KIT TURNOVER KIT B (KITS) ×2 IMPLANT
MANIFOLD NEPTUNE II (INSTRUMENTS) ×2 IMPLANT
NDL HYPO 25GX1X1/2 BEV (NEEDLE) ×1 IMPLANT
NEEDLE HYPO 25GX1X1/2 BEV (NEEDLE) ×2 IMPLANT
NS IRRIG 1000ML POUR BTL (IV SOLUTION) ×2 IMPLANT
OPHTHALMIC BETADINE 5% (OPHTHALMIC)
PACK SHOULDER (CUSTOM PROCEDURE TRAY) ×2 IMPLANT
PACK UNIVERSAL I (CUSTOM PROCEDURE TRAY) ×1 IMPLANT
PAD ARMBOARD 7.5X6 YLW CONV (MISCELLANEOUS) ×4 IMPLANT
PAD COLD SHLDR WRAP-ON (PAD) ×2 IMPLANT
PENCIL SMOKE EVACUATOR (MISCELLANEOUS) ×1 IMPLANT
PIN GUIDE 3X75 SHOULDER (PIN) IMPLANT
RESTRAINT HEAD UNIVERSAL NS (MISCELLANEOUS) ×2 IMPLANT
SCREW 5.0X18 (Screw) ×2 IMPLANT
SCREW 5.5X26 (Screw) ×2 IMPLANT
SCREW BONE INTRNL SM 7 (Screw) ×1 IMPLANT
SET HNDPC FAN SPRY TIP SCT (DISPOSABLE) ×1 IMPLANT
SLING ARM IMMOBILIZER LRG (SOFTGOODS) ×2 IMPLANT
SPONGE T-LAP 18X18 ~~LOC~~+RFID (SPONGE) ×2 IMPLANT
STAPLER VISISTAT 35W (STAPLE) ×2 IMPLANT
STEM HUM PERFORM 3+ (Stem) ×1 IMPLANT
STRIP CLOSURE SKIN 1/2X4 (GAUZE/BANDAGES/DRESSINGS) ×1 IMPLANT
SUCTION FRAZIER HANDLE 10FR (MISCELLANEOUS) ×1
SUCTION TUBE FRAZIER 10FR DISP (MISCELLANEOUS) ×1 IMPLANT
SUT FIBERWIRE #2 38 T-5 BLUE (SUTURE) ×4
SUT MNCRL AB 3-0 PS2 18 (SUTURE) ×1 IMPLANT
SUT VIC AB 0 CT1 27 (SUTURE) ×1
SUT VIC AB 0 CT1 27XBRD ANBCTR (SUTURE) ×1 IMPLANT
SUT VIC AB 2-0 CT1 27 (SUTURE) ×2
SUT VIC AB 2-0 CT1 TAPERPNT 27 (SUTURE) IMPLANT
SUTURE FIBERWR #2 38 T-5 BLUE (SUTURE) IMPLANT
SYR CONTROL 10ML LL (SYRINGE) ×1 IMPLANT
SYSTEM HUMERAL INSERT SZ3 4X36 (Orthopedic Implant) IMPLANT
TOWEL GREEN STERILE (TOWEL DISPOSABLE) ×2 IMPLANT
TOWER CARTRIDGE SMART MIX (DISPOSABLE) ×1 IMPLANT
TRAY FOLEY MTR SLVR 16FR STAT (SET/KITS/TRAYS/PACK) ×1 IMPLANT
TUBE SUCT ARGYLE STRL (TUBING) ×1 IMPLANT
WATER STERILE IRR 1000ML POUR (IV SOLUTION) ×2 IMPLANT
YANKAUER SUCT BULB TIP NO VENT (SUCTIONS) ×1 IMPLANT

## 2021-09-09 NOTE — Interval H&P Note (Signed)
History and Physical Interval Note:  09/09/2021 11:10 AM  Natalie Whitney  has presented today for surgery, with the diagnosis of LEFT SHOULDER IRREPARABLE ROTATOR CUFF ARTHROPATHY.  The various methods of treatment have been discussed with the patient and family. After consideration of risks, benefits and other options for treatment, the patient has consented to  Procedure(s): LEFT REVERSE SHOULDER ARTHROPLASTY (Left) as a surgical intervention.  The patient's history has been reviewed, patient examined, no change in status, stable for surgery.  I have reviewed the patient's chart and labs.  Questions were answered to the patient's satisfaction.     Huel Cote

## 2021-09-09 NOTE — Transfer of Care (Signed)
Immediate Anesthesia Transfer of Care Note  Patient: Natalie Whitney  Procedure(s) Performed: LEFT REVERSE SHOULDER ARTHROPLASTY (Left: Shoulder)  Patient Location: PACU  Anesthesia Type:General and Regional  Level of Consciousness: drowsy  Airway & Oxygen Therapy: Patient Spontanous Breathing and Patient connected to face mask oxygen  Post-op Assessment: Report given to RN and Post -op Vital signs reviewed and stable  Post vital signs: Reviewed and stable  Last Vitals:  Vitals Value Taken Time  BP    Temp    Pulse    Resp    SpO2      Last Pain:  Vitals:   09/09/21 1051  TempSrc:   PainSc: 0-No pain         Complications: No notable events documented.

## 2021-09-09 NOTE — Op Note (Signed)
Date of Surgery: 09/09/2021  INDICATIONS: [name] is a 76 y.o.-year-old female with left shoulder rotator cuff arthropathy that is failed conservative management.  The risk and benefits of the procedure were discussed in detail and documented in the pre-operative evaluation.   PREOPERATIVE DIAGNOSIS: 1.  Left rotator cuff arthropathy shoulder  POSTOPERATIVE DIAGNOSIS: Same.  PROCEDURE: 1.  Left reverse shoulder arthroplasty 2.  Left shoulder biceps tenodesis  SURGEON: Benancio Deeds MD  ASSISTANT: Kerby Less, ATC  ANESTHESIA:  general plus interscalene nerve block  IV FLUIDS AND URINE: See anesthesia record.  ANTIBIOTICS: Ancef 2 g  ESTIMATED BLOOD LOSS: 25 mL.  IMPLANTS:  Implant Name Type Inv. Item Serial No. Manufacturer Lot No. LRB No. Used Action  SCREW 5.5X26 - [id number] Screw SCREW 5.5X26  [employer] ON TRAY Left 2 Implanted  SCREW 5.0X18 - [id number] Screw SCREW 5.0X18  [employer] ON TRAY Left 2 Implanted  GLENOSPHERE REV SHOULDER 36 - [id number] Joint GLENOSPHERE REV SHOULDER 36 [id number] [employer]  Left 1 Implanted  SCREW BONE INTRNL SM 7 - [id number] Screw SCREW BONE INTRNL SM 7 [id number] [employer]  Left 1 Implanted  BASEPLATE GLENOSPHERE 25 STD - [id number] Miscellaneous BASEPLATE GLENOSPHERE 25 STD [id number] [employer]  Left 1 Implanted  STEM HUM PERFORM 3+ - [id number] Stem STEM HUM PERFORM 3+ [id number] [employer]  Left 1 Implanted  HUMERAL SYS INSERT SZ3 4X36 - [id number] Orthopedic Implant HUMERAL SYS INSERT SZ3 4X36 [id number] [employer]  Left 1 Implanted    DRAINS: None  CULTURES: None  COMPLICATIONS: none  DESCRIPTION OF PROCEDURE:   Patient was identified in the preoperative holding area.  Anesthesia performed an interscalene nerve block after universal timeout was performed with nursing.  Ancef was given 1 hour prior to skin incision.    The surgical site was scrubbed with a chlorhexidine scrub brush and alcohol.  The patient was then  prepped with chlorhexidine skin prep.  The patient was subsequently taken back to the operating room.  Anesthesia was induced.  He was transferred to the beachchair position.  All bony prominences were padded.  Final timeout was again performed.     The bony landmarks of the shoulder were marked with a marking pen. A delto-pectoral incision was made, extending up approximately 5 inches. The wound with then irrigated with dilute betadine. Cephalic vein was identified, and an protected. This was retracted medially. Subdeltoid and subpectoral lesions were released. Neurovascular structures were carefully protected. The Gelpi retractor was used to retract the deltoid and pectoralis major. A 1 cm release was performed on the upper pectoralis.   The deltoid was retracted laterally with a Brown humeral retractor.  The conjoined tendon was identified. The cleido-pectoral fascia was excised.  The axillary nerve was palpated and carefully protected throughout the procedure. The biceps tendon was found and tenodesed to the upper pec with # 2 FiberWire.  Proximally the biceps tendon was removed up to the joint.  The bicipital groove was used for a landmark to establish rotator cuff interval. The subscap was tagged with a #2 FiberWire.  At this point the subscap was peeled off from the lesser tuberosity with care to avoid dissection distally in order to protect the axillary nerve.  Once the joint was exposed the proximal humerus was delivered with external rotation and extension of the arm. The humerus was prepped initially by performing a humeral neck cut. This was done with the guide using 20 degrees of retroversion as a reference.  The head portion was removed.  A medullary sounding reamer was then used.  We subsequently placed our guidewire through the center of the humeral head using the reference guide.  This was a size 3.  Metaphyseal reamer was then used.  Finally the size 3 broach was malleted into place with  excellent purchase.  A tonsil clamp was used to attempt to pull this out with very good purchase   Attention was then turned to the glenoid.  Posteriorly a large Darach retractor was used.  A 360 Degree release of the subscapularis and glenoid were done. The capsule was released from the humerus.    Glenoid retractors were placed posteriorly, superiorly behind the biceps tendon and anteriorly on the glenoid neck. A 360-degree release of the capsule was performed with cautery.  The triceps was released off the inferior tubercle of the glenoid. The axillary nerve was carefully protected with the surgeon's index finger, retracting it and using cautery.   A guidepin was placed through the glenoid guide. The guidepin was drilled until it exited the cortex. The guidepin was over drilled. Next, the glenoid was prepared with the reamer  down to cortical bone.  The central peg hole was totally within the scapular neck tested with the probe.  The baseplate was then placed screwed securely with good purchase in position and then secured with 4 screws. In each case, they were drilled and measured and the appropriate length screw placed with excellent rigid fixation of the baseplate.    A +6 liner was then used with the appropriate broach.  This was brought to just the level of the reduction but not completely reduced.  A +6 final poly was selected and impacted.    Appropriate tension was noted on the conjoined tendon and deltoid muscle.  Extension was stable, external and internal rotation as well.  The subscap was pulled over but as this was not able to reach comfortably decision was made not to repair in order to prevent limited in external rotation.  The wound was then irrigated. Vancomycin powder was placed in the wound again for infection prevention.   The wound was then closed in layers with 0 Vicryl interrupted in the deep subcu followed by 2-0 Vicryl in the superficial subcu and 3-0 monocryl for skin.  An  Aquacel dressing was applied as well as an Veterinary surgeon.  A shoulder immobilizer was applied.          POSTOPERATIVE PLAN: X-ray will be obtained in PACU.  She will be weightbearing as tolerated on the left arm with reverse shoulder precautions.  She will begin physical therapy closer to her home.  We will provide an external referral therapy for this.  I will see her back in 2 weeks for wound check.  Benancio Deeds, MD 3:11 PM

## 2021-09-09 NOTE — Anesthesia Procedure Notes (Signed)
Anesthesia Regional Block: Interscalene brachial plexus block   Pre-Anesthetic Checklist: , timeout performed,  Correct Patient, Correct Site, Correct Laterality,  Correct Procedure, Correct Position, site marked,  Risks and benefits discussed,  Surgical consent,  Pre-op evaluation,  At surgeon's request and post-op pain management  Laterality: Left  Prep: chloraprep       Needles:  Injection technique: Single-shot  Needle Type: Echogenic Stimulator Needle     Needle Length: 10cm  Needle Gauge: 21   Needle insertion depth: 6 cm   Additional Needles:   Procedures:,,,, ultrasound used (permanent image in chart),,    Narrative:  Start time: 09/09/2021 12:03 PM End time: 09/09/2021 12:10 PM Injection made incrementally with aspirations every 5 mL.  Performed by: Personally  Anesthesiologist: Mal Amabile, MD  Additional Notes: Timeout performed. Patient sedated. Relevant anatomy ID'd using [place]. Incremental 2-74ml injection of LA with frequent aspiration. Patient tolerated procedure well.     Left Interscalene Block

## 2021-09-09 NOTE — Anesthesia Preprocedure Evaluation (Addendum)
Anesthesia Evaluation  Patient identified by MRN, date of birth, ID band Patient awake    Reviewed: Allergy & Precautions, NPO status , Patient's Chart, lab work & pertinent test results  History of Anesthesia Complications (+) Family history of anesthesia reaction  Airway Mallampati: II  TM Distance: >3 FB Neck ROM: Full    Dental no notable dental hx. (+) Dental Advisory Given   Pulmonary sleep apnea , pneumonia, resolved,    Pulmonary exam normal breath sounds clear to auscultation       Cardiovascular hypertension, Pt. on medications Normal cardiovascular exam Rhythm:Regular Rate:Normal     Neuro/Psych negative neurological ROS  negative psych ROS   GI/Hepatic Neg liver ROS, hiatal hernia,   Endo/Other  diabetes, Well Controlled, Type 2, Oral Hypoglycemic AgentsObesity Hyperlipidemia  Renal/GU negative Renal ROS  negative genitourinary   Musculoskeletal  (+) Arthritis , Osteoarthritis,  Left shoulder rotator arthropathy   Abdominal (+) + obese,   Peds  Hematology negative hematology ROS (+)   Anesthesia Other Findings   Reproductive/Obstetrics                            Anesthesia Physical Anesthesia Plan  ASA: 3  Anesthesia Plan: General   Post-op Pain Management: Regional block* and Minimal or no pain anticipated   Induction: Intravenous  PONV Risk Score and Plan: 4 or greater and Treatment may vary due to age or medical condition, Ondansetron and Dexamethasone  Airway Management Planned: Oral ETT  Additional Equipment: None  Intra-op Plan:   Post-operative Plan: Extubation in OR  Informed Consent: I have reviewed the patients History and Physical, chart, labs and discussed the procedure including the risks, benefits and alternatives for the proposed anesthesia with the patient or authorized representative who has indicated his/her understanding and acceptance.      Dental advisory given  Plan Discussed with: CRNA and Anesthesiologist  Anesthesia Plan Comments:         Anesthesia Quick Evaluation

## 2021-09-09 NOTE — Anesthesia Postprocedure Evaluation (Signed)
Anesthesia Post Note  Patient: Natalie Whitney  Procedure(s) Performed: LEFT REVERSE SHOULDER ARTHROPLASTY (Left: Shoulder)     Patient location during evaluation: PACU Anesthesia Type: General Level of consciousness: awake and alert and oriented Pain management: pain level controlled Vital Signs Assessment: post-procedure vital signs reviewed and stable Respiratory status: spontaneous breathing, nonlabored ventilation and respiratory function stable Cardiovascular status: stable and blood pressure returned to baseline Postop Assessment: no apparent nausea or vomiting Anesthetic complications: no   No notable events documented.  Last Vitals:  Vitals:   09/09/21 1535 09/09/21 1550  BP: 104/83 106/65  Pulse: 65 67  Resp: 12 17  Temp:  36.7 C  SpO2: 96% 97%    Last Pain:  Vitals:   09/09/21 1520  TempSrc:   PainSc: 0-No pain                 Zeev Deakins A.

## 2021-09-09 NOTE — Discharge Instructions (Signed)
     Discharge Instructions    Attending Surgeon: Huel Cote, MD Office Phone Number: [phone]   Diagnosis and Procedures:    Surgeries Performed: Left shoulder reverse shoulder arthroplasty  Discharge Plan:    Diet: Resume usual diet. Begin with light or bland foods.  Drink plenty of fluids.  Activity:  Keep sling and dressing in place until your follow up visit in Physical Therapy You are advised to go home directly from the hospital or surgical center. Restrict your activities.  GENERAL INSTRUCTIONS: 1.  Keep your surgical site elevated above your heart for at least 5-7 days or longer to prevent swelling. This will improve your comfort and your overall recovery following surgery.     2. Please call Dr. Serena Croissant office at [phone] with questions Monday-Friday during business hours. If no one answers, please leave a message and someone should get back to the patient within 24 hours. For emergencies please call 911 or proceed to the emergency room.   3. Patient to notify surgical team if experiences any of the following: Bowel/Bladder dysfunction, uncontrolled pain, nerve/muscle weakness, incision with increased drainage or redness, nausea/vomiting and Fever greater than 101.0 F.  Be alert for signs of infection including redness, streaking, odor, fever or chills. Be alert for excessive pain or bleeding and notify your surgeon immediately.  WOUND INSTRUCTIONS:   Leave your dressing/cast/splint in place until your post operative visit.  Keep it clean and dry.  Always keep the incision clean and dry until the staples/sutures are removed. If there is no drainage from the incision you should keep it open to air. If there is drainage from the incision you must keep it covered at all times until the drainage stops  Do not soak in a bath tub, hot tub, pool, lake or other body of water until 21 days after your surgery and your incision is completely dry and healed.  If you  have removable sutures (or staples) they must be removed 10-14 days (unless otherwise instructed) from the day of your surgery.     1)  Elevate the extremity as much as possible.  2)  Keep the dressing clean and dry.  3)  Please call us if the dressing becomes wet or dirty.  4)  If you are experiencing worsening pain or worsening swelling, please call.     MEDICATIONS: Resume all previous home medications at the previous prescribed dose and frequency unless otherwise noted Start taking the  pain medications on an as-needed basis as prescribed  Please taper down pain medication over the next week following surgery.  Ideally you should not require a refill of any narcotic pain medication.  Take pain medication with food to minimize nausea. In addition to the prescribed pain medication, you may take over-the-counter pain relievers such as Tylenol.  Do NOT take additional tylenol if your pain medication already has tylenol in it.  Aspirin 325mg  daily for four weeks.      FOLLOWUP INSTRUCTIONS: 1. Follow up at the Physical Therapy Clinic 3-4 days following surgery. This appointment should be scheduled unless other arrangements have been made.The Physical Therapy scheduling number is [phone] if an appointment has not already been arranged.  2. Contact Dr. [phone] office during office hours at [phone] or the practice after hours line at [phone] for non-emergencies. For medical emergencies call 911.   Discharge Location: Home

## 2021-09-09 NOTE — Brief Op Note (Signed)
   Brief Op Note  Date of Surgery: 09/09/2021  Preoperative Diagnosis: LEFT SHOULDER IRREPARABLE ROTATOR CUFF ARTHROPATHY  Postoperative Diagnosis: same  Procedure: Procedure(s): LEFT REVERSE SHOULDER ARTHROPLASTY  Implants: Implant Name Type Inv. Item Serial No. Manufacturer Lot No. LRB No. Used Action  SCREW 5.5X26 - [id number] Screw SCREW 5.5X26  [employer] ON TRAY Left 2 Implanted  SCREW 5.0X18 - [id number] Screw SCREW 5.0X18  [employer] ON TRAY Left 2 Implanted  GLENOSPHERE REV SHOULDER 36 - [id number] Joint GLENOSPHERE REV SHOULDER 36 [id number] [employer]  Left 1 Implanted  SCREW BONE INTRNL SM 7 - [id number] Screw SCREW BONE INTRNL SM 7 [id number] [employer]  Left 1 Implanted  BASEPLATE GLENOSPHERE 25 STD - [id number] Miscellaneous BASEPLATE GLENOSPHERE 25 STD [id number] [employer]  Left 1 Implanted  STEM HUM PERFORM 3+ - [id number] Stem STEM HUM PERFORM 3+ [id number] [employer]  Left 1 Implanted  HUMERAL SYS INSERT SZ3 4X36 - [id number] Orthopedic Implant HUMERAL SYS INSERT SZ3 4X36 [id number] [employer]  Left 1 Implanted    Surgeons: Surgeon(s): Huel Cote, MD  Anesthesia: General    Estimated Blood Loss: See anesthesia record  Complications: None  Condition to PACU: Stable  Benancio Deeds, MD 09/09/2021 3:11 PM

## 2021-09-09 NOTE — Anesthesia Procedure Notes (Signed)
Procedure Name: Intubation Date/Time: 09/09/2021 1:27 PM  Performed by: Ezequiel Kayser, CRNAPre-anesthesia Checklist: Patient identified, Emergency Drugs available, Suction available and Patient being monitored Patient Re-evaluated:Patient Re-evaluated prior to induction Oxygen Delivery Method: Circle System Utilized Preoxygenation: Pre-oxygenation with 100% oxygen Induction Type: IV induction Ventilation: Mask ventilation without difficulty Laryngoscope Size: Mac and 3 Grade View: Grade I Tube type: Oral Tube size: 7.0 mm Number of attempts: 1 Airway Equipment and Method: Stylet and Oral airway Placement Confirmation: ETT inserted through vocal cords under direct vision, positive ETCO2 and breath sounds checked- equal and bilateral Secured at: 21 cm Tube secured with: Tape Dental Injury: Teeth and Oropharynx as per pre-operative assessment

## 2021-09-09 NOTE — H&P (Signed)
Chief Complaint: Left shoulder pain        History of Present Illness:      Natalie Whitney is a 76 y.o. female right-hand-dominant presents with left shoulder pain that has been bothering her significantly since January.  At this time she did have a fall against a wall for which she suffered multiple injuries.  She did have a right handed small finger fracture which was treated by my partner Dr. Frazier Butt.  Since that time she is noted significant weakness and limited range of motion about the left shoulder.  This has progressed significantly over the course of the last several months.  She is having a very difficult time with basic things reaching the arm up to the face to do grooming or her hair.  She endorses significant weakness with overhead activity and pain in the lateral aspect of the shoulder particularly at night with laying directly on the side.  She did have an injection 1 month prior with her primary doctor which gave her minimal relief.  She has been in physical therapy for multiple issues including the back and hand which has afforded her the opportunity to work on the shoulder with little relief.  At this time she is quite concerned about her ability to work overhead or do basic things like grooming.  She was previously very active in tennis although she is no longer able to do this as a result of her shoulder and contralateral small finger issue.  She is a non-smoker.  She does have type 2 diabetes with a last A1c in the fives.       Surgical History:   None regarding left shoulder   PMH/PSH/Family History/Social History/Meds/Allergies:         Past Medical History:  Diagnosis Date   Arthritis     Diabetes mellitus without complication (HCC)     Family history of adverse reaction to anesthesia      daughter has nausea   Heart murmur     History of hiatal hernia     Hypertension     Pneumonia     Sleep apnea      stopped cpap         Past Surgical  History:  Procedure Laterality Date   APPENDECTOMY       BREAST SURGERY        breast biopsy- unsure of side   COLONOSCOPY       DILATION AND CURETTAGE OF UTERUS       EYE SURGERY   2020    bilateral cataract removal   FINGER ARTHRODESIS Right 04/22/2021    Procedure: Right small finger PIP ARTHRODESIS FINGER;  Surgeon: Marlyne Beards, MD;  Location: [facility];  Service: Orthopedics;  Laterality: Right;   PROXIMAL INTERPHALANGEAL FUSION (PIP) Right 06/05/2021    Procedure: RIGHT SMALL FINGER REVISION INTERPHALANGEAL FUSION;  Surgeon: Marlyne Beards, MD;  Location: [facility];  Service: Orthopedics;  Laterality: Right;   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    Procedure: TOTAL KNEE ARTHROPLASTY;  Surgeon: Nadara Mustard, MD;  Location: [facility];  Service: Orthopedics;  Laterality: Right;    Social History         Socioeconomic History   Marital status: Divorced      Spouse name: Not on file   Number of children: Not on file   Years of education: Not on file   Highest education level: Not on file  Occupational History  Not on file  Tobacco Use   Smoking status: Never   Smokeless tobacco: Never  Vaping Use   Vaping Use: Never used  Substance and Sexual Activity   Alcohol use: No   Drug use: No   Sexual activity: Not on file  Other Topics Concern   Not on file  Social History Narrative   Not on file    Social Determinants of Health    Financial Resource Strain: Not on file  Food Insecurity: Not on file  Transportation Needs: Not on file  Physical Activity: Not on file  Stress: Not on file  Social Connections: Not on file    No family history on file.      Allergies  Allergen Reactions   Penicillins Rash      Was told not to take it per Dr          Current Outpatient Medications  Medication Sig Dispense Refill   acetaminophen (TYLENOL) 500 MG tablet Take 1 tablet (500 mg total) by mouth every 8 (eight) hours for 10 days. 30 tablet 0   aspirin EC  325 MG tablet Take 1 tablet (325 mg total) by mouth daily. 30 tablet 0   ibuprofen (ADVIL) 800 MG tablet Take 1 tablet (800 mg total) by mouth every 8 (eight) hours for 10 days. Please take with food, please alternate with acetaminophen 30 tablet 0   oxyCODONE (OXY IR/ROXICODONE) 5 MG immediate release tablet Take 1 tablet (5 mg total) by mouth every 4 (four) hours as needed (severe pain). 20 tablet 0   Ascorbic Acid Buffered (BUFFERED VITAMIN C) 1000 MG CAPS Take 1,000 mg by mouth in the morning and at bedtime.       aspirin EC 81 MG tablet Take 81 mg by mouth at bedtime. Swallow whole.       atorvastatin (LIPITOR) 20 MG tablet Take 20 mg by mouth in the morning.       baclofen (LIORESAL) 10 MG tablet Take 10 mg by mouth 3 (three) times daily.       Cholecalciferol (VITAMIN D3 PO) Take 2,500 Units by mouth every Monday, Tuesday, Wednesday, Thursday, and Friday.       empagliflozin (JARDIANCE) 25 MG TABS tablet Take 25 mg by mouth in the morning.       gabapentin (NEURONTIN) 300 MG capsule Take 300 mg by mouth 3 (three) times daily.       Glucosamine HCl 1000 MG TABS Take 1,000 mg by mouth 2 (two) times daily.       liothyronine (CYTOMEL) 5 MCG tablet Take 10 mcg by mouth daily before breakfast.       lisinopril (PRINIVIL,ZESTRIL) 20 MG tablet Take 20 mg by mouth in the morning.       Magnesium 500 MG TABS Take 500 mg by mouth See admin instructions. Take 1 tablet by mouth on Sundays through Fridays, skip Saturdays       metFORMIN (GLUCOPHAGE) 500 MG tablet Take 500 mg by mouth 2 (two) times daily.       Methylcobalamin 5000 MCG TBDP Take 5,000 mcg by mouth daily with breakfast. NOW Supplements, Methyl B-12 (Methylcobalamin)       Methylsulfonylmethane (MSM) 1000 MG TABS Take 1,000 mg by mouth 2 (two) times daily.       Multiple Vitamin (MULTIVITAMIN WITH MINERALS) TABS tablet Take 1 tablet by mouth every evening.       Omega-3 Fatty Acids (FISH OIL PO) Take 15 mLs by mouth in the  morning and at  bedtime. tablespoon       Probiotic Product (PROBIOTIC PO) Take 1 capsule by mouth in the morning and at bedtime. Now Foods Gr8-Dophilus       Semaglutide (OZEMPIC, 0.25 OR 0.5 MG/DOSE, [place]) Inject 0.5 mg into the skin every Wednesday.       spironolactone (ALDACTONE) 25 MG tablet Take 25 mg by mouth in the morning.       ZINC GLUCONATE PO Take 30 mg by mouth 2 (two) times a week. On Mondays & Fridays ONLY        No current facility-administered medications for this visit.    Imaging Results (Last 48 hours)  No results found.     Review of Systems:   A ROS was performed including pertinent positives and negatives as documented in the HPI.   Physical Exam :   Constitutional: NAD and appears stated age Neurological: Alert and oriented Psych: Appropriate affect and cooperative There were no vitals taken for this visit.    Comprehensive Musculoskeletal Exam:     Musculoskeletal Exam      Inspection Right Left  Skin No atrophy or winging No atrophy or winging  Palpation      Tenderness None Glenohumeral as well as lateral deltoid  Range of Motion      Flexion (passive) 170 100  Flexion (active) 170 100  Abduction 170 90  ER at the side 70 -10  Can reach behind back to T12 Back pocket  Strength        Full 4 out of 5 with supra and infraspinatus as well as a positive belly press  Special Tests      Pseudoparalytic No No  Neurologic      Fires PIN, radial, median, ulnar, musculocutaneous, axillary, suprascapular, long thoracic, and spinal accessory innervated muscles. No abnormal sensibility  Vascular/Lymphatic      Radial Pulse 2+ 2+  Cervical Exam      Patient has symmetric cervical range of motion with negative Spurling's test.  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  This is massive.  The muscle belly has a significantly positive tangent sign on sagittal T1 view of the supraspinatus.  There is significant fatty infiltration of this muscle.  There is a superior border subscapularis tear with biceps subluxation.  There is a significant amount of chondral loss particularly involving the superior portion of the glenoid.   I personally reviewed and interpreted the radiographs.     Assessment:   76 y.o. female right-hand-dominant presents with left shoulder pain consistent with rotator cuff arthropathy.  At today's visit I did discuss that I do believe that many of her issues are a result of the injury that happened in January where she landed up against a wall.  Unfortunately she does have fairly significant rotator cuff atrophy at this time which I think would lend itself last to her repair.  We did discuss additional treatment options including additional injection versus additional physical therapy for strengthening of the left shoulder although I am again less confident in this option given her full-thickness massive rotator cuff tear.  At this time given the fact that she has trialed multiple nonoperative treatments, we did have a discussion regarding possible reverse shoulder arthroplasty.  I believe that this would give her the most durable option to have a successful longer-term outcome.  I did describe that while a repair would be possible I think that given her age and comorbidities along with MRI findings this would be unlikely to heal.  We discussed that there are several complications that can come with reverse shoulder arthroplasty.  After discussion of continued nonoperative management versus shoulder arthroplasty she is elected for a left shoulder reverse shoulder arthroplasty.   Plan :     -Plan for left shoulder reverse shoulder arthroplasty       After a lengthy discussion of treatment options, including risks, benefits, alternatives,  complications of surgical and nonsurgical conservative options, the patient elected surgical repair.    The patient  is aware of the material risks  and complications including, but not limited to injury to adjacent structures, neurovascular injury, infection, numbness, bleeding, implant failure, thermal burns, stiffness, persistent pain, failure to heal, disease transmission from allograft, need for further surgery, dislocation, anesthetic risks, blood clots, risks of death,and others. The probabilities of surgical success and failure discussed with patient given their particular co-morbidities.The time and nature of expected rehabilitation and recovery was discussed.The patient's questions were all answered preoperatively.  No barriers to understanding were noted. I explained the natural history of the disease process and Rx rationale.  I explained to the patient what I considered to be reasonable expectations given their personal situation.  The final treatment plan was arrived at through a shared patient decision making process model.           I personally saw and evaluated the patient, and participated in the management and treatment plan.   Huel Cote, MD Attending Physician, Orthopedic Surgery

## 2021-09-10 ENCOUNTER — Encounter (HOSPITAL_COMMUNITY): Payer: Self-pay | Admitting: Orthopaedic Surgery

## 2021-09-11 ENCOUNTER — Telehealth: Payer: Self-pay

## 2021-09-11 ENCOUNTER — Encounter (HOSPITAL_COMMUNITY): Payer: Self-pay | Admitting: Orthopaedic Surgery

## 2021-09-11 NOTE — Telephone Encounter (Signed)
Natalie Whitney with [facility] would like office note and if patient has a protocol to be faxed to [phone].  Patient has appointment on Friday, 09/13/2021.  Cb# [phone].  Please advise.  Thank you.

## 2021-09-11 NOTE — Telephone Encounter (Signed)
Op note faxed to [facility]

## 2021-09-19 ENCOUNTER — Telehealth: Payer: Self-pay | Admitting: Orthopaedic Surgery

## 2021-09-19 NOTE — Telephone Encounter (Signed)
Pt called requesting more info to be sent to her physical therapy referral was sent to. Pt states therapy wants to know if she can work range of motion. Please call and fax more notes for detailed therapy needed. Pt phone number is [phone].

## 2021-09-23 ENCOUNTER — Telehealth (HOSPITAL_BASED_OUTPATIENT_CLINIC_OR_DEPARTMENT_OTHER): Payer: Self-pay | Admitting: Orthopaedic Surgery

## 2021-09-23 NOTE — Telephone Encounter (Signed)
Pt called and said [facility] had not received the [facility] protocol. Pt gave me [facility] phone number so I could confirm fax number. Spoke with Annice Pih  with [facility] who gave me [phone] as fax number. [facility] faxed to [facility]

## 2021-09-25 ENCOUNTER — Ambulatory Visit (INDEPENDENT_AMBULATORY_CARE_PROVIDER_SITE_OTHER): Payer: Medicare Other

## 2021-09-25 ENCOUNTER — Ambulatory Visit (INDEPENDENT_AMBULATORY_CARE_PROVIDER_SITE_OTHER): Payer: Medicare Other | Admitting: Orthopaedic Surgery

## 2021-09-25 DIAGNOSIS — Z9889 Other specified postprocedural states: Secondary | ICD-10-CM

## 2021-09-25 NOTE — Progress Notes (Signed)
Post Operative Evaluation    Procedure/Date of Surgery: Left reverse shoulder arthroplasty September 08, 2021  Interval History:    Presents today 2 weeks status post left shoulder arthroplasty overall doing extremely well.  She has been compliant with aspirin.  She has been taking ibuprofen for pain.  She has been working with physical therapy multiple times weekly.  Denies any pain at today's visit.  She has been working on range of motion of the left shoulder   PMH/PSH/Family History/Social History/Meds/Allergies:    Past Medical History:  Diagnosis Date   Arthritis    Diabetes mellitus without complication (HCC)    Family history of adverse reaction to anesthesia    daughter has nausea   Heart murmur    History of hiatal hernia    Hypertension    Pneumonia    Sleep apnea    stopped cpap   Past Surgical History:  Procedure Laterality Date   APPENDECTOMY     BREAST SURGERY     breast biopsy- unsure of side   COLONOSCOPY     DILATION AND CURETTAGE OF UTERUS     EYE SURGERY  2020   bilateral cataract removal   FINGER ARTHRODESIS Right 04/22/2021   Procedure: Right small finger PIP ARTHRODESIS FINGER;  Surgeon: Marlyne Beards, MD;  Location: [facility];  Service: Orthopedics;  Laterality: Right;   PROXIMAL INTERPHALANGEAL FUSION (PIP) Right 06/05/2021   Procedure: RIGHT SMALL FINGER REVISION INTERPHALANGEAL FUSION;  Surgeon: Marlyne Beards, MD;  Location: [facility];  Service: Orthopedics;  Laterality: Right;   REVERSE SHOULDER ARTHROPLASTY Left 09/09/2021   Procedure: LEFT REVERSE SHOULDER ARTHROPLASTY;  Surgeon: Huel Cote, MD;  Location: [facility];  Service: Orthopedics;  Laterality: Left;   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   Procedure: TOTAL KNEE ARTHROPLASTY;  Surgeon: Nadara Mustard, MD;  Location: [facility];  Service: Orthopedics;  Laterality: Right;   Social History   Socioeconomic History   Marital status: Divorced     Spouse name: Not on file   Number of children: Not on file   Years of education: Not on file   Highest education level: Not on file  Occupational History   Not on file  Tobacco Use   Smoking status: Never   Smokeless tobacco: Never  Vaping Use   Vaping Use: Never used  Substance and Sexual Activity   Alcohol use: No   Drug use: No   Sexual activity: Not on file  Other Topics Concern   Not on file  Social History Narrative   Not on file   Social Determinants of Health   Financial Resource Strain: Not on file  Food Insecurity: Not on file  Transportation Needs: Not on file  Physical Activity: Not on file  Stress: Not on file  Social Connections: Not on file   Family History  Problem Relation Age of Onset   Cancer - Lung Mother    Rectal cancer Sister    Breast cancer Child    Allergies  Allergen Reactions   Penicillins Rash    Was told not to take it per Dr   Current Outpatient Medications  Medication Sig Dispense Refill   Ascorbic Acid Buffered (BUFFERED VITAMIN C) 1000 MG CAPS Take 1,000 mg by mouth in the morning and at bedtime.  aspirin EC 325 MG tablet Take 1 tablet (325 mg total) by mouth daily. 30 tablet 0   atorvastatin (LIPITOR) 20 MG tablet Take 20 mg by mouth in the morning.     Cholecalciferol (VITAMIN D3 PO) Take 2,500 Units by mouth every Monday, Tuesday, Wednesday, Thursday, and Friday.     dapagliflozin propanediol (FARXIGA) 5 MG TABS tablet Take by mouth daily.     gabapentin (NEURONTIN) 300 MG capsule Take 300 mg by mouth 3 (three) times daily.     Glucosamine HCl 1000 MG TABS Take 1,000 mg by mouth 2 (two) times daily.     liothyronine (CYTOMEL) 5 MCG tablet Take 10 mcg by mouth daily before breakfast.     lisinopril (PRINIVIL,ZESTRIL) 20 MG tablet Take 20 mg by mouth in the morning.     Magnesium 500 MG TABS Take 500 mg by mouth See admin instructions. Take 500 mg by mouth on Sundays through Fridays, skip Saturdays     metFORMIN  (GLUCOPHAGE) 500 MG tablet Take 500 mg by mouth 2 (two) times daily.     Methylcobalamin 5000 MCG TBDP Take 5,000 mcg by mouth every Sunday.     Methylsulfonylmethane (MSM) 1000 MG TABS Take 1,000 mg by mouth 2 (two) times daily.     Multiple Vitamin (MULTIVITAMIN WITH MINERALS) TABS tablet Take 1 tablet by mouth every evening.     nitrofurantoin, macrocrystal-monohydrate, (MACROBID) 100 MG capsule Take 100 mg by mouth 2 (two) times daily.     Omega-3 Fatty Acids (FISH OIL PO) Take 15 mLs by mouth in the morning and at bedtime.     oxyCODONE (OXY IR/ROXICODONE) 5 MG immediate release tablet Take 1 tablet (5 mg total) by mouth every 4 (four) hours as needed (severe pain). 20 tablet 0   Probiotic Product (PROBIOTIC PO) Take 1 capsule by mouth in the morning and at bedtime. Now Foods Gr8-Dophilus     Semaglutide (OZEMPIC, 0.25 OR 0.5 MG/DOSE, [place]) Inject 0.25 mg into the skin every Wednesday.     spironolactone (ALDACTONE) 25 MG tablet Take 25 mg by mouth in the morning.     Zinc Gluconate 30 MG TABS Take 30 mg by mouth 2 (two) times a week. Mondays & Fridays     No current facility-administered medications for this visit.   No results found.  Review of Systems:   A ROS was performed including pertinent positives and negatives as documented in the HPI.   Musculoskeletal Exam:     Left shoulder incision is clean dry and intact.  No erythema or drainage.  Active forward elevation is to 90 degrees.  External rotation at the side is to 45 degrees.  2+ radial pulse.  Distal neurosensory exam is intact.  Internal rotation examination deferred  Imaging:    3 views left shoulder: Status post left reverse shoulder arthroplasty without complication  I personally reviewed and interpreted the radiographs.   Assessment:   76 year old female who is 2 weeks status post left reverse shoulder arthroplasty overall doing extremely well.  I will like her to continue to continue to advance per protocol.   She will continue to limit internal rotation for an additional 2 weeks.  She will continue to wear her sling at night.  I will plan to see her back in 4 weeks for reassessment.  She will begin active and active assisted forward elevation external rotation  Plan :    -Return to clinic in 4 weeks for reassessment  I personally saw and evaluated the patient, and participated in the management and treatment plan.  Vanetta Mulders, MD Attending Physician, Orthopedic Surgery  This document was dictated using Dragon voice recognition software. A reasonable attempt at proof reading has been made to minimize errors.

## 2021-10-23 ENCOUNTER — Ambulatory Visit (INDEPENDENT_AMBULATORY_CARE_PROVIDER_SITE_OTHER): Payer: Medicare Other | Admitting: Orthopaedic Surgery

## 2021-10-23 DIAGNOSIS — Z9889 Other specified postprocedural states: Secondary | ICD-10-CM

## 2021-10-23 NOTE — Progress Notes (Signed)
Post Operative Evaluation    Procedure/Date of Surgery: Left reverse shoulder arthroplasty September 08, 2021  Interval History:    Post left reverse shoulder arthroplasty overall doing very well.  She has no pain left shoulder.  She is continue to work with physical therapy close to her house.  She was told that she should not work past any amount of pain.  She is here today for further assessment.   PMH/PSH/Family History/Social History/Meds/Allergies:    Past Medical History:  Diagnosis Date   Arthritis    Diabetes mellitus without complication (HCC)    Family history of adverse reaction to anesthesia    daughter has nausea   Heart murmur    History of hiatal hernia    Hypertension    Pneumonia    Sleep apnea    stopped cpap   Past Surgical History:  Procedure Laterality Date   APPENDECTOMY     BREAST SURGERY     breast biopsy- unsure of side   COLONOSCOPY     DILATION AND CURETTAGE OF UTERUS     EYE SURGERY  2020   bilateral cataract removal   FINGER ARTHRODESIS Right 04/22/2021   Procedure: Right small finger PIP ARTHRODESIS FINGER;  Surgeon: Marlyne Beards, MD;  Location: [facility];  Service: Orthopedics;  Laterality: Right;   PROXIMAL INTERPHALANGEAL FUSION (PIP) Right 06/05/2021   Procedure: RIGHT SMALL FINGER REVISION INTERPHALANGEAL FUSION;  Surgeon: Marlyne Beards, MD;  Location: [facility];  Service: Orthopedics;  Laterality: Right;   REVERSE SHOULDER ARTHROPLASTY Left 09/09/2021   Procedure: LEFT REVERSE SHOULDER ARTHROPLASTY;  Surgeon: Huel Cote, MD;  Location: [facility];  Service: Orthopedics;  Laterality: Left;   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   Procedure: TOTAL KNEE ARTHROPLASTY;  Surgeon: Nadara Mustard, MD;  Location: [facility];  Service: Orthopedics;  Laterality: Right;   Social History   Socioeconomic History   Marital status: Divorced    Spouse name: Not on file   Number of children: Not on file    Years of education: Not on file   Highest education level: Not on file  Occupational History   Not on file  Tobacco Use   Smoking status: Never   Smokeless tobacco: Never  Vaping Use   Vaping Use: Never used  Substance and Sexual Activity   Alcohol use: No   Drug use: No   Sexual activity: Not on file  Other Topics Concern   Not on file  Social History Narrative   Not on file   Social Determinants of Health   Financial Resource Strain: Not on file  Food Insecurity: Not on file  Transportation Needs: Not on file  Physical Activity: Not on file  Stress: Not on file  Social Connections: Not on file   Family History  Problem Relation Age of Onset   Cancer - Lung Mother    Rectal cancer Sister    Breast cancer Child    Allergies  Allergen Reactions   Penicillins Rash    Was told not to take it per Dr   Current Outpatient Medications  Medication Sig Dispense Refill   Ascorbic Acid Buffered (BUFFERED VITAMIN C) 1000 MG CAPS Take 1,000 mg by mouth in the morning and at bedtime.     aspirin EC 325 MG tablet Take 1  tablet (325 mg total) by mouth daily. 30 tablet 0   atorvastatin (LIPITOR) 20 MG tablet Take 20 mg by mouth in the morning.     Cholecalciferol (VITAMIN D3 PO) Take 2,500 Units by mouth every Monday, Tuesday, Wednesday, Thursday, and Friday.     dapagliflozin propanediol (FARXIGA) 5 MG TABS tablet Take by mouth daily.     gabapentin (NEURONTIN) 300 MG capsule Take 300 mg by mouth 3 (three) times daily.     Glucosamine HCl 1000 MG TABS Take 1,000 mg by mouth 2 (two) times daily.     liothyronine (CYTOMEL) 5 MCG tablet Take 10 mcg by mouth daily before breakfast.     lisinopril (PRINIVIL,ZESTRIL) 20 MG tablet Take 20 mg by mouth in the morning.     Magnesium 500 MG TABS Take 500 mg by mouth See admin instructions. Take 500 mg by mouth on Sundays through Fridays, skip Saturdays     metFORMIN (GLUCOPHAGE) 500 MG tablet Take 500 mg by mouth 2 (two) times daily.      Methylcobalamin 5000 MCG TBDP Take 5,000 mcg by mouth every Sunday.     Methylsulfonylmethane (MSM) 1000 MG TABS Take 1,000 mg by mouth 2 (two) times daily.     Multiple Vitamin (MULTIVITAMIN WITH MINERALS) TABS tablet Take 1 tablet by mouth every evening.     nitrofurantoin, macrocrystal-monohydrate, (MACROBID) 100 MG capsule Take 100 mg by mouth 2 (two) times daily.     Omega-3 Fatty Acids (FISH OIL PO) Take 15 mLs by mouth in the morning and at bedtime.     oxyCODONE (OXY IR/ROXICODONE) 5 MG immediate release tablet Take 1 tablet (5 mg total) by mouth every 4 (four) hours as needed (severe pain). 20 tablet 0   Probiotic Product (PROBIOTIC PO) Take 1 capsule by mouth in the morning and at bedtime. Now Foods Gr8-Dophilus     Semaglutide (OZEMPIC, 0.25 OR 0.5 MG/DOSE, [place]) Inject 0.25 mg into the skin every Wednesday.     spironolactone (ALDACTONE) 25 MG tablet Take 25 mg by mouth in the morning.     Zinc Gluconate 30 MG TABS Take 30 mg by mouth 2 (two) times a week. Mondays & Fridays     No current facility-administered medications for this visit.   No results found.  Review of Systems:   A ROS was performed including pertinent positives and negatives as documented in the HPI.   Musculoskeletal Exam:     Left shoulder incision is healed.  No erythema or drainage.  Active forward elevation is to 100 degrees.  External rotation at the side is to 50 degrees.  2+ radial pulse.  Distal neurosensory exam is intact.  Internal rotation is to back pocket Imaging:    3 views left shoulder: Status post left reverse shoulder arthroplasty without complication  I personally reviewed and interpreted the radiographs.   Assessment:   75 year old female who is 6 weeks status post left shoulder reverse shoulder arthroplasty overall doing very well.  I specifically advised her on the strengthening program and showed her a series of exercises she may perform in a supine position.  She will consider  purchasing a pulley to assist with overhead active and active assisted range of motion Plan :    -Return to clinic in 6 weeks for reassessment      I personally saw and evaluated the patient, and participated in the management and treatment plan.  Huel Cote, MD Attending Physician, Orthopedic Surgery  This document was dictated using  Dragon [employer] forces training and education officer. A reasonable attempt at proof reading has been made to minimize errors.

## 2021-11-01 ENCOUNTER — Ambulatory Visit (INDEPENDENT_AMBULATORY_CARE_PROVIDER_SITE_OTHER): Payer: Medicare Other | Admitting: Orthopedic Surgery

## 2021-11-01 ENCOUNTER — Ambulatory Visit (INDEPENDENT_AMBULATORY_CARE_PROVIDER_SITE_OTHER): Payer: Medicare Other

## 2021-11-01 ENCOUNTER — Encounter: Payer: Self-pay | Admitting: Orthopedic Surgery

## 2021-11-01 DIAGNOSIS — M20021 Boutonniere deformity of right finger(s): Secondary | ICD-10-CM

## 2021-11-01 NOTE — Progress Notes (Signed)
Office Visit Note   Patient: Natalie Whitney           Date of Birth: Dec 20, 1945           MRN: [id number] Visit Date: 11/01/2021              Requested by: Alinda Deem, MD [address] [place],  [place] [address] PCP: Alinda Deem, MD   Assessment & Plan: Visit Diagnoses:  1. Central slip extensor tendon injury (boutonniere), right     Plan: Patient is now ~5 months out from her right small finger PIP arthrodesis for Boutonierre injury that has failed conservative management.  She is doing very well.  X-rays show a healed arthrodesis.  She has no pain in this finger.  She does have some stiffness in the DIP joint but this does not seem to bother her.  She can see me again as needed.   Follow-Up Instructions: No follow-ups on file.   Orders:  Orders Placed This Encounter  Procedures   XR Finger Little Right   No orders of the defined types were placed in this encounter.     Procedures: No procedures performed   Clinical Data: No additional findings.   Subjective: Chief Complaint  Patient presents with   Left Little Finger - Follow-up    Is a 76 year old female who presents for follow-up of a right small finger boutonniere injury.  She had a ground-level fall with a central slip avulsion fracture that we try to treat nonoperatively with a period of immobilization.  This failed unfortunately.  She then underwent PIP arthrodesis with a tension band wire construct.  When the wires backed out this was revised.  She is doing well today.  She has no pain in the small finger.  She does have some stiffness at the DIP joint but this is not terribly bothersome for her.  She is overall without complaint.    Review of Systems   Objective: Vital Signs: There were no vitals taken for this visit.  Physical Exam  Right Hand Exam   Tenderness  The patient is experiencing no tenderness.   Other  Erythema: absent Sensation: normal Pulse: present  Comments:  PIP  arthrodesis stable without clinical motion.  Stiffness at the DIP joint of the small finger.       Specialty Comments:  No specialty comments available.  Imaging: No results found.   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   Past Medical History:  Diagnosis Date   Arthritis    Diabetes mellitus without complication (HCC)    Family history of adverse reaction to anesthesia    daughter has nausea   Heart murmur    History of hiatal hernia    Hypertension    Pneumonia    Sleep apnea    stopped cpap    Family History  Problem Relation Age of Onset   Cancer - Lung Mother    Rectal cancer Sister    Breast cancer Child     Past Surgical History:  Procedure Laterality Date   APPENDECTOMY     BREAST SURGERY     breast biopsy- unsure of side   COLONOSCOPY     DILATION AND CURETTAGE OF UTERUS     EYE SURGERY  2020   bilateral cataract removal   FINGER ARTHRODESIS Right 04/22/2021   Procedure: Right small finger PIP ARTHRODESIS FINGER;  Surgeon: Marlyne Beards, MD;  [facility];  Service: Orthopedics;  Laterality: Right;   PROXIMAL INTERPHALANGEAL FUSION (PIP) Right 06/05/2021   Procedure: RIGHT SMALL FINGER REVISION INTERPHALANGEAL FUSION;  Surgeon: Marlyne Beards, MD;  Location: [facility];  Service: Orthopedics;  Laterality: Right;   REVERSE SHOULDER ARTHROPLASTY Left 09/09/2021   Procedure: LEFT REVERSE SHOULDER ARTHROPLASTY;  Surgeon: Huel Cote, MD;  [facility]: [facility];  Service: Orthopedics;  Laterality: Left;   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   Procedure: TOTAL KNEE ARTHROPLASTY;  Surgeon: Nadara Mustard, MD;  Location: [facility];  Service: Orthopedics;  Laterality: Right;   Social History   Occupational History   Not on  file  Tobacco Use   Smoking status: Never   Smokeless tobacco: Never  Vaping Use   Vaping Use: Never used  Substance and Sexual Activity   Alcohol use: No   Drug use: No   Sexual activity: Not on file

## 2021-12-04 ENCOUNTER — Encounter (HOSPITAL_BASED_OUTPATIENT_CLINIC_OR_DEPARTMENT_OTHER): Payer: PRIVATE HEALTH INSURANCE | Admitting: Orthopaedic Surgery

## 2021-12-06 ENCOUNTER — Ambulatory Visit (INDEPENDENT_AMBULATORY_CARE_PROVIDER_SITE_OTHER): Payer: Medicare Other | Admitting: Orthopaedic Surgery

## 2021-12-06 ENCOUNTER — Ambulatory Visit (INDEPENDENT_AMBULATORY_CARE_PROVIDER_SITE_OTHER): Payer: Medicare Other

## 2021-12-06 DIAGNOSIS — Z9889 Other specified postprocedural states: Secondary | ICD-10-CM

## 2021-12-06 NOTE — Progress Notes (Signed)
Post Operative Evaluation    Procedure/Date of Surgery: Left reverse shoulder arthroplasty September 08, 2021  Interval History:   Presents today status post left reverse shoulder arthroplasty.  She is now 3 months out.  Overall she is doing extremely well.  She is quite happy with range of motion.  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   PMH/PSH/Family History/Social History/Meds/Allergies:    Past Medical History:  Diagnosis Date   Arthritis    Diabetes mellitus without complication (HCC)    Family history of adverse reaction to anesthesia    daughter has nausea   Heart murmur    History of hiatal hernia    Hypertension    Pneumonia    Sleep apnea    stopped cpap   Past Surgical History:  Procedure Laterality Date   APPENDECTOMY     BREAST SURGERY     breast biopsy- unsure of side   COLONOSCOPY     DILATION AND CURETTAGE OF UTERUS     EYE SURGERY  2020   bilateral cataract removal   FINGER ARTHRODESIS Right 04/22/2021   Procedure: Right small finger PIP ARTHRODESIS FINGER;  Surgeon: Sherilyn Cooter, MD;  Location: [facility];  Service: Orthopedics;  Laterality: Right;   PROXIMAL INTERPHALANGEAL FUSION (PIP) Right 06/05/2021   Procedure: RIGHT SMALL FINGER REVISION INTERPHALANGEAL FUSION;  Surgeon: Sherilyn Cooter, MD;  Location: [facility];  Service: Orthopedics;  Laterality: Right;   REVERSE SHOULDER ARTHROPLASTY Left 09/09/2021   Procedure: LEFT REVERSE SHOULDER ARTHROPLASTY;  Surgeon: Vanetta Mulders, MD;  Location: [facility];  Service: Orthopedics;  Laterality: Left;   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   Procedure: TOTAL KNEE ARTHROPLASTY;  Surgeon: Newt Minion, MD;  Location: [facility];  Service: Orthopedics;  Laterality: Right;   Social History   Socioeconomic History   Marital status: Divorced    Spouse name: Not on file   Number of children: Not on file    Years of education: Not on file   Highest education level: Not on file  Occupational History   Not on file  Tobacco Use   Smoking status: Never   Smokeless tobacco: Never  Vaping Use   Vaping Use: Never used  Substance and Sexual Activity   Alcohol use: No   Drug use: No   Sexual activity: Not on file  Other Topics Concern   Not on file  Social History Narrative   Not on file   Social Determinants of Health   Financial Resource Strain: Not on file  Food Insecurity: Not on file  Transportation Needs: Not on file  Physical Activity: Not on file  Stress: Not on file  Social Connections: Not on file   Family History  Problem Relation Age of Onset   Cancer - Lung Mother    Rectal cancer Sister    Breast cancer Child    Allergies  Allergen Reactions   Penicillins Rash    Was told not to take it per Dr   Current Outpatient Medications  Medication Sig Dispense Refill   Ascorbic Acid Buffered (BUFFERED VITAMIN C) 1000 MG CAPS Take 1,000 mg by mouth in the morning and at bedtime.     aspirin EC 325 MG tablet Take 1 tablet (325 mg total) by  mouth daily. 30 tablet 0   atorvastatin (LIPITOR) 20 MG tablet Take 20 mg by mouth in the morning.     Cholecalciferol (VITAMIN D3 PO) Take 2,500 Units by mouth every Monday, Tuesday, Wednesday, Thursday, and Friday.     dapagliflozin propanediol (FARXIGA) 5 MG TABS tablet Take by mouth daily.     gabapentin (NEURONTIN) 300 MG capsule Take 300 mg by mouth 3 (three) times daily.     Glucosamine HCl 1000 MG TABS Take 1,000 mg by mouth 2 (two) times daily.     liothyronine (CYTOMEL) 5 MCG tablet Take 10 mcg by mouth daily before breakfast.     lisinopril (PRINIVIL,ZESTRIL) 20 MG tablet Take 20 mg by mouth in the morning.     Magnesium 500 MG TABS Take 500 mg by mouth See admin instructions. Take 500 mg by mouth on Sundays through Fridays, skip Saturdays     metFORMIN (GLUCOPHAGE) 500 MG tablet Take 500 mg by mouth 2 (two) times daily.      Methylcobalamin 5000 MCG TBDP Take 5,000 mcg by mouth every Sunday.     Methylsulfonylmethane (MSM) 1000 MG TABS Take 1,000 mg by mouth 2 (two) times daily.     Multiple Vitamin (MULTIVITAMIN WITH MINERALS) TABS tablet Take 1 tablet by mouth every evening.     nitrofurantoin, macrocrystal-monohydrate, (MACROBID) 100 MG capsule Take 100 mg by mouth 2 (two) times daily.     Omega-3 Fatty Acids (FISH OIL PO) Take 15 mLs by mouth in the morning and at bedtime.     oxyCODONE (OXY IR/ROXICODONE) 5 MG immediate release tablet Take 1 tablet (5 mg total) by mouth every 4 (four) hours as needed (severe pain). 20 tablet 0   Probiotic Product (PROBIOTIC PO) Take 1 capsule by mouth in the morning and at bedtime. Now Foods Gr8-Dophilus     Semaglutide (OZEMPIC, 0.25 OR 0.5 MG/DOSE, [place]) Inject 0.25 mg into the skin every Wednesday.     spironolactone (ALDACTONE) 25 MG tablet Take 25 mg by mouth in the morning.     Zinc Gluconate 30 MG TABS Take 30 mg by mouth 2 (two) times a week. Mondays & Fridays     No current facility-administered medications for this visit.   No results found.  Review of Systems:   A ROS was performed including pertinent positives and negatives as documented in the HPI.   Musculoskeletal Exam:     Left shoulder incision is healed.  No erythema or drainage.  Active forward elevation is to 130 degrees.  External rotation at the side is to 50 degrees.  General rotation is to back pocket.  2+ radial pulse.  Distal neurosensory exam is intact.   Imaging:    3 views left shoulder: Status post left reverse shoulder arthroplasty without complication  I personally reviewed and interpreted the radiographs.   Assessment:   76 year old female who is 3 months status post left shoulder reverse shoulder arthroplasty.  Overall she continues to improve.  At today's visit her range of motion is improved significantly.  At this time she will continue to work on strengthening and overhead  activity Plan :    -Return to clinic in 3 months for reassessment      I personally saw and evaluated the patient, and participated in the management and treatment plan.  Huel Cote, MD Attending Physician, Orthopedic Surgery  This document was dictated using Dragon voice recognition software. A reasonable attempt at proof reading has been made to minimize errors.

## 2021-12-12 ENCOUNTER — Other Ambulatory Visit (HOSPITAL_COMMUNITY): Payer: Self-pay

## 2021-12-25 ENCOUNTER — Telehealth: Payer: Self-pay | Admitting: Orthopaedic Surgery

## 2021-12-25 NOTE — Telephone Encounter (Signed)
After verbal discussion with Dr. Sammuel Hines, I spoke to the patient and informed her he was okay with her proceeding with her other procedures

## 2021-12-25 NOTE — Telephone Encounter (Signed)
Pt called and is wondering if she can go ahead and get either her back or hernia surgeries done.   [phone]  [phone]

## 2022-02-28 ENCOUNTER — Ambulatory Visit (INDEPENDENT_AMBULATORY_CARE_PROVIDER_SITE_OTHER): Payer: Medicare Other | Admitting: Orthopaedic Surgery

## 2022-02-28 ENCOUNTER — Ambulatory Visit (INDEPENDENT_AMBULATORY_CARE_PROVIDER_SITE_OTHER): Payer: Medicare Other

## 2022-02-28 DIAGNOSIS — Z9889 Other specified postprocedural states: Secondary | ICD-10-CM | POA: Diagnosis not present

## 2022-02-28 DIAGNOSIS — M25512 Pain in left shoulder: Secondary | ICD-10-CM | POA: Diagnosis not present

## 2022-02-28 NOTE — Progress Notes (Signed)
Post Operative Evaluation    Procedure/Date of Surgery: Left reverse shoulder arthroplasty September 08, 2021  Interval History:   Natalie Whitney presents today for left shoulder.  Overall she is doing extremely well.  Forward elevation is improved.  She is having some difficulty with external rotation although this is improving slowly.  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   PMH/PSH/Family History/Social History/Meds/Allergies:    Past Medical History:  Diagnosis Date   Arthritis    Diabetes mellitus without complication (HCC)    Family history of adverse reaction to anesthesia    daughter has nausea   Heart murmur    History of hiatal hernia    Hypertension    Pneumonia    Sleep apnea    stopped cpap   Past Surgical History:  Procedure Laterality Date   APPENDECTOMY     BREAST SURGERY     breast biopsy- unsure of side   COLONOSCOPY     DILATION AND CURETTAGE OF UTERUS     EYE SURGERY  2020   bilateral cataract removal   FINGER ARTHRODESIS Right 04/22/2021   Procedure: Right small finger PIP ARTHRODESIS FINGER;  Surgeon: Marlyne Beards, MD;  Location: [facility];  Service: Orthopedics;  Laterality: Right;   PROXIMAL INTERPHALANGEAL FUSION (PIP) Right 06/05/2021   Procedure: RIGHT SMALL FINGER REVISION INTERPHALANGEAL FUSION;  Surgeon: Marlyne Beards, MD;  Location: [facility];  Service: Orthopedics;  Laterality: Right;   REVERSE SHOULDER ARTHROPLASTY Left 09/09/2021   Procedure: LEFT REVERSE SHOULDER ARTHROPLASTY;  Surgeon: Huel Cote, MD;  [facility];  Service: Orthopedics;  Laterality: Left;   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   Procedure: TOTAL KNEE ARTHROPLASTY;  Surgeon: Nadara Mustard, MD;  [facility];  Service: Orthopedics;  Laterality: Right;   Social History   Socioeconomic History   Marital status: Divorced    Spouse name: Not on file   Number of children: Not on  file   Years of education: Not on file   Highest education level: Not on file  Occupational History   Not on file  Tobacco Use   Smoking status: Never   Smokeless tobacco: Never  Vaping Use   Vaping Use: Never used  Substance and Sexual Activity   Alcohol use: No   Drug use: No   Sexual activity: Not on file  Other Topics Concern   Not on file  Social History Narrative   Not on file   Social Determinants of Health   Financial Resource Strain: Not on file  Food Insecurity: Not on file  Transportation Needs: Not on file  Physical Activity: Not on file  Stress: Not on file  Social Connections: Not on file   Family History  Problem Relation Age of Onset   Cancer - Lung Mother    Rectal cancer Sister    Breast cancer Child    Allergies  Allergen Reactions   Penicillins Rash    Was told not to take it per Dr   Current Outpatient Medications  Medication Sig Dispense Refill   Ascorbic Acid Buffered (BUFFERED VITAMIN C) 1000 MG CAPS Take 1,000 mg by mouth in the morning and at bedtime.     aspirin EC 325 MG tablet Take 1 tablet (325 mg total)  by mouth daily. 30 tablet 0   atorvastatin (LIPITOR) 20 MG tablet Take 20 mg by mouth in the morning.     Cholecalciferol (VITAMIN D3 PO) Take 2,500 Units by mouth every Monday, Tuesday, Wednesday, Thursday, and Friday.     dapagliflozin propanediol (FARXIGA) 5 MG TABS tablet Take by mouth daily.     gabapentin (NEURONTIN) 300 MG capsule Take 300 mg by mouth 3 (three) times daily.     Glucosamine HCl 1000 MG TABS Take 1,000 mg by mouth 2 (two) times daily.     liothyronine (CYTOMEL) 5 MCG tablet Take 10 mcg by mouth daily before breakfast.     lisinopril (PRINIVIL,ZESTRIL) 20 MG tablet Take 20 mg by mouth in the morning.     Magnesium 500 MG TABS Take 500 mg by mouth See admin instructions. Take 500 mg by mouth on Sundays through Fridays, skip Saturdays     metFORMIN (GLUCOPHAGE) 500 MG tablet Take 500 mg by mouth 2 (two) times daily.      Methylcobalamin 5000 MCG TBDP Take 5,000 mcg by mouth every Sunday.     Methylsulfonylmethane (MSM) 1000 MG TABS Take 1,000 mg by mouth 2 (two) times daily.     Multiple Vitamin (MULTIVITAMIN WITH MINERALS) TABS tablet Take 1 tablet by mouth every evening.     nitrofurantoin, macrocrystal-monohydrate, (MACROBID) 100 MG capsule Take 100 mg by mouth 2 (two) times daily.     Omega-3 Fatty Acids (FISH OIL PO) Take 15 mLs by mouth in the morning and at bedtime.     oxyCODONE (OXY IR/ROXICODONE) 5 MG immediate release tablet Take 1 tablet (5 mg total) by mouth every 4 (four) hours as needed (severe pain). 20 tablet 0   Probiotic Product (PROBIOTIC PO) Take 1 capsule by mouth in the morning and at bedtime. Now Foods Gr8-Dophilus     Semaglutide (OZEMPIC, 0.25 OR 0.5 MG/DOSE, ) Inject 0.25 mg into the skin every Wednesday.     spironolactone (ALDACTONE) 25 MG tablet Take 25 mg by mouth in the morning.     Zinc Gluconate 30 MG TABS Take 30 mg by mouth 2 (two) times a week. Mondays & Fridays     No current facility-administered medications for this visit.   No results found.  Review of Systems:   A ROS was performed including pertinent positives and negatives as documented in the HPI.   Musculoskeletal Exam:     Left shoulder incision is healed.  No erythema or drainage.  Active forward elevation is to 130 degrees.  External rotation at the side is to 50 degrees.  General rotation is to back pocket.  2+ radial pulse.  Distal neurosensory exam is intact.   Imaging:    3 views left shoulder: Status post left reverse shoulder arthroplasty without complication  I personally reviewed and interpreted the radiographs.   Assessment:   76 year old female who is 6 months status post left reverse shoulder arthroplasty.  At this time I would like to see her back in 3 months.  I did provide specific exercises for her to continue to work on her external rotation. Plan :    -Return to clinic in 3  months for reassessment      I personally saw and evaluated the patient, and participated in the management and treatment plan.  Huel Cote, MD Attending Physician, Orthopedic Surgery  This document was dictated using Dragon voice recognition software. A reasonable attempt at proof reading has been made to minimize errors.

## 2022-03-07 ENCOUNTER — Ambulatory Visit (HOSPITAL_BASED_OUTPATIENT_CLINIC_OR_DEPARTMENT_OTHER): Payer: Medicare Other | Admitting: Orthopaedic Surgery

## 2022-05-06 ENCOUNTER — Other Ambulatory Visit (HOSPITAL_COMMUNITY): Payer: Self-pay

## 2022-05-08 ENCOUNTER — Other Ambulatory Visit (HOSPITAL_COMMUNITY): Payer: Self-pay

## 2022-05-30 ENCOUNTER — Ambulatory Visit (INDEPENDENT_AMBULATORY_CARE_PROVIDER_SITE_OTHER): Payer: Medicare Other | Admitting: Orthopaedic Surgery

## 2022-05-30 DIAGNOSIS — M79644 Pain in right finger(s): Secondary | ICD-10-CM

## 2022-05-30 DIAGNOSIS — Z9889 Other specified postprocedural states: Secondary | ICD-10-CM | POA: Diagnosis not present

## 2022-05-30 NOTE — Progress Notes (Signed)
Post Operative Evaluation    Procedure/Date of Surgery: Left reverse shoulder arthroplasty September 08, 2021  Interval History:   Natalie Whitney presents today for left shoulder.  Overall she is doing extremely well.  Overhead motion is improved dramatically.  She has no pain.  She did recently have some weakness with lifting a jar out of an overhead cabinet.  She states that she recently was cutting up old closed compared to rags and since that time woke up with a very painful right thumb base pain.   PMH/PSH/Family History/Social History/Meds/Allergies:    Past Medical History:  Diagnosis Date   Arthritis    Diabetes mellitus without complication (HCC)    Family history of adverse reaction to anesthesia    daughter has nausea   Heart murmur    History of hiatal hernia    Hypertension    Pneumonia    Sleep apnea    stopped cpap   Past Surgical History:  Procedure Laterality Date   APPENDECTOMY     BREAST SURGERY     breast biopsy- unsure of side   COLONOSCOPY     DILATION AND CURETTAGE OF UTERUS     EYE SURGERY  2020   bilateral cataract removal   FINGER ARTHRODESIS Right 04/22/2021   Procedure: Right small finger PIP ARTHRODESIS FINGER;  Surgeon: Sherilyn Cooter, MD;  Location: [facility];  Service: Orthopedics;  Laterality: Right;   PROXIMAL INTERPHALANGEAL FUSION (PIP) Right 06/05/2021   Procedure: RIGHT SMALL FINGER REVISION INTERPHALANGEAL FUSION;  Surgeon: Sherilyn Cooter, MD;  Location: [facility];  Service: Orthopedics;  Laterality: Right;   REVERSE SHOULDER ARTHROPLASTY Left 09/09/2021   Procedure: LEFT REVERSE SHOULDER ARTHROPLASTY;  Surgeon: Vanetta Mulders, MD;  Location: [facility];  Service: Orthopedics;  Laterality: Left;   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   Procedure: TOTAL KNEE ARTHROPLASTY;  Surgeon: Newt Minion, MD;  Location: [facility];  Service: Orthopedics;  Laterality: Right;   Social History   Socioeconomic  History   Marital status: Divorced    Spouse name: Not on file   Number of children: Not on file   Years of education: Not on file   Highest education level: Not on file  Occupational History   Not on file  Tobacco Use   Smoking status: Never   Smokeless tobacco: Never  Vaping Use   Vaping Use: Never used  Substance and Sexual Activity   Alcohol use: No   Drug use: No   Sexual activity: Not on file  Other Topics Concern   Not on file  Social History Narrative   Not on file   Social Determinants of Health   Financial Resource Strain: Not on file  Food Insecurity: Not on file  Transportation Needs: Not on file  Physical Activity: Not on file  Stress: Not on file  Social Connections: Not on file   Family History  Problem Relation Age of Onset   Cancer - Lung Mother    Rectal cancer Sister    Breast cancer Child    Allergies  Allergen Reactions   Penicillins Rash    Was told not to take it per Dr   Current Outpatient Medications  Medication Sig Dispense Refill   Ascorbic Acid Buffered (BUFFERED VITAMIN C) 1000 MG CAPS Take 1,000 mg by mouth in  the morning and at bedtime.     aspirin EC 325 MG tablet Take 1 tablet (325 mg total) by mouth daily. 30 tablet 0   atorvastatin (LIPITOR) 20 MG tablet Take 20 mg by mouth in the morning.     Cholecalciferol (VITAMIN D3 PO) Take 2,500 Units by mouth every Monday, Tuesday, Wednesday, Thursday, and Friday.     dapagliflozin propanediol (FARXIGA) 5 MG TABS tablet Take by mouth daily.     gabapentin (NEURONTIN) 300 MG capsule Take 300 mg by mouth 3 (three) times daily.     Glucosamine HCl 1000 MG TABS Take 1,000 mg by mouth 2 (two) times daily.     liothyronine (CYTOMEL) 5 MCG tablet Take 10 mcg by mouth daily before breakfast.     lisinopril (PRINIVIL,ZESTRIL) 20 MG tablet Take 20 mg by mouth in the morning.     Magnesium 500 MG TABS Take 500 mg by mouth See admin instructions. Take 500 mg by mouth on Sundays through Fridays, skip  Saturdays     metFORMIN (GLUCOPHAGE) 500 MG tablet Take 500 mg by mouth 2 (two) times daily.     Methylcobalamin 5000 MCG TBDP Take 5,000 mcg by mouth every Sunday.     Methylsulfonylmethane (MSM) 1000 MG TABS Take 1,000 mg by mouth 2 (two) times daily.     Multiple Vitamin (MULTIVITAMIN WITH MINERALS) TABS tablet Take 1 tablet by mouth every evening.     nitrofurantoin, macrocrystal-monohydrate, (MACROBID) 100 MG capsule Take 100 mg by mouth 2 (two) times daily.     Omega-3 Fatty Acids (FISH OIL PO) Take 15 mLs by mouth in the morning and at bedtime.     oxyCODONE (OXY IR/ROXICODONE) 5 MG immediate release tablet Take 1 tablet (5 mg total) by mouth every 4 (four) hours as needed (severe pain). 20 tablet 0   Probiotic Product (PROBIOTIC PO) Take 1 capsule by mouth in the morning and at bedtime. [employer] Gr8-Dophilus     Semaglutide (OZEMPIC, 0.25 OR 0.5 MG/DOSE, [place]) Inject 0.25 mg into the skin every Wednesday.     spironolactone (ALDACTONE) 25 MG tablet Take 25 mg by mouth in the morning.     Zinc Gluconate 30 MG TABS Take 30 mg by mouth 2 (two) times a week. Mondays & Fridays     No current facility-administered medications for this visit.   No results found.  Review of Systems:   A ROS was performed including pertinent positives and negatives as documented in the HPI.   Musculoskeletal Exam:     Left shoulder incision is healed.  No erythema or drainage.  Active forward elevation is to 150 degrees compared to 145 on the right.  External rotation at the side is to 50 degrees bilaterally.  Internal rotation is to back pocket.  2+ radial pulse.  Distal neurosensory exam is intact.  Some weakness with 4+ out of 5 strength compared to the contralateral side Imaging:    3 views left shoulder: Status post left reverse shoulder arthroplasty without complication  I personally reviewed and interpreted the radiographs.   Assessment:   77 year old female who is 9 months status post left  reverse shoulder arthroplasty.  She has been overall dramatically improving with strength and range of motion.  Her left shoulder motion is now increased compared to the contralateral side.  Overall she is very happy with how she is doing.  At this time I will plan to see her back on an as-needed basis.  With regard to  her right thumb base I do believe this is flared up some arthritis although this is doing much better with bracing and activity restriction.  I have advised that she may come back and see me if not complete resolution in 2 weeks and at that time we would possibly consider a CMC injection Plan :    -Return to clinic as needed      I personally saw and evaluated the patient, and participated in the management and treatment plan.  Vanetta Mulders, MD Attending Physician, Orthopedic Surgery  This document was dictated using Dragon voice recognition software. A reasonable attempt at proof reading has been made to minimize errors.

## 2022-07-30 IMAGING — DX DG SHOULDER 2+V*L*
3 series · 3 of 3 positions shown · non-contrast
Comparison: None Available.

CLINICAL DATA: Chronic left shoulder pain.  No known injury.

EXAM:
LEFT SHOULDER - 2+ VIEW

[shoulder grashey]
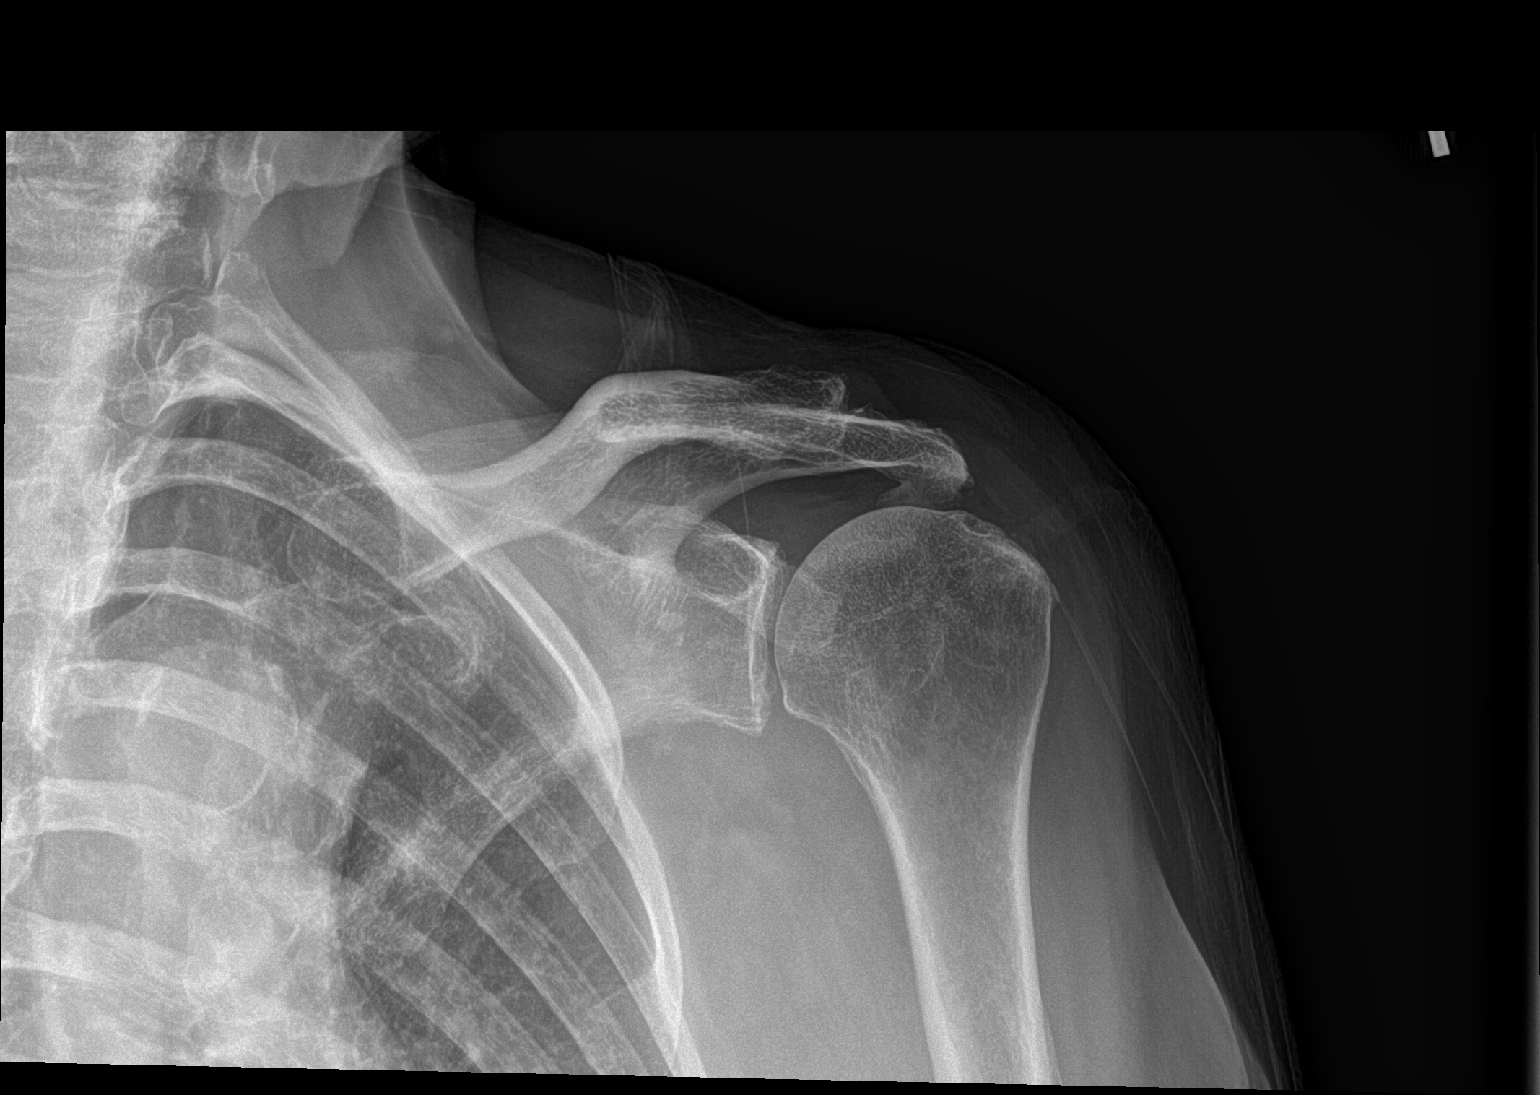

[shoulder axillary]
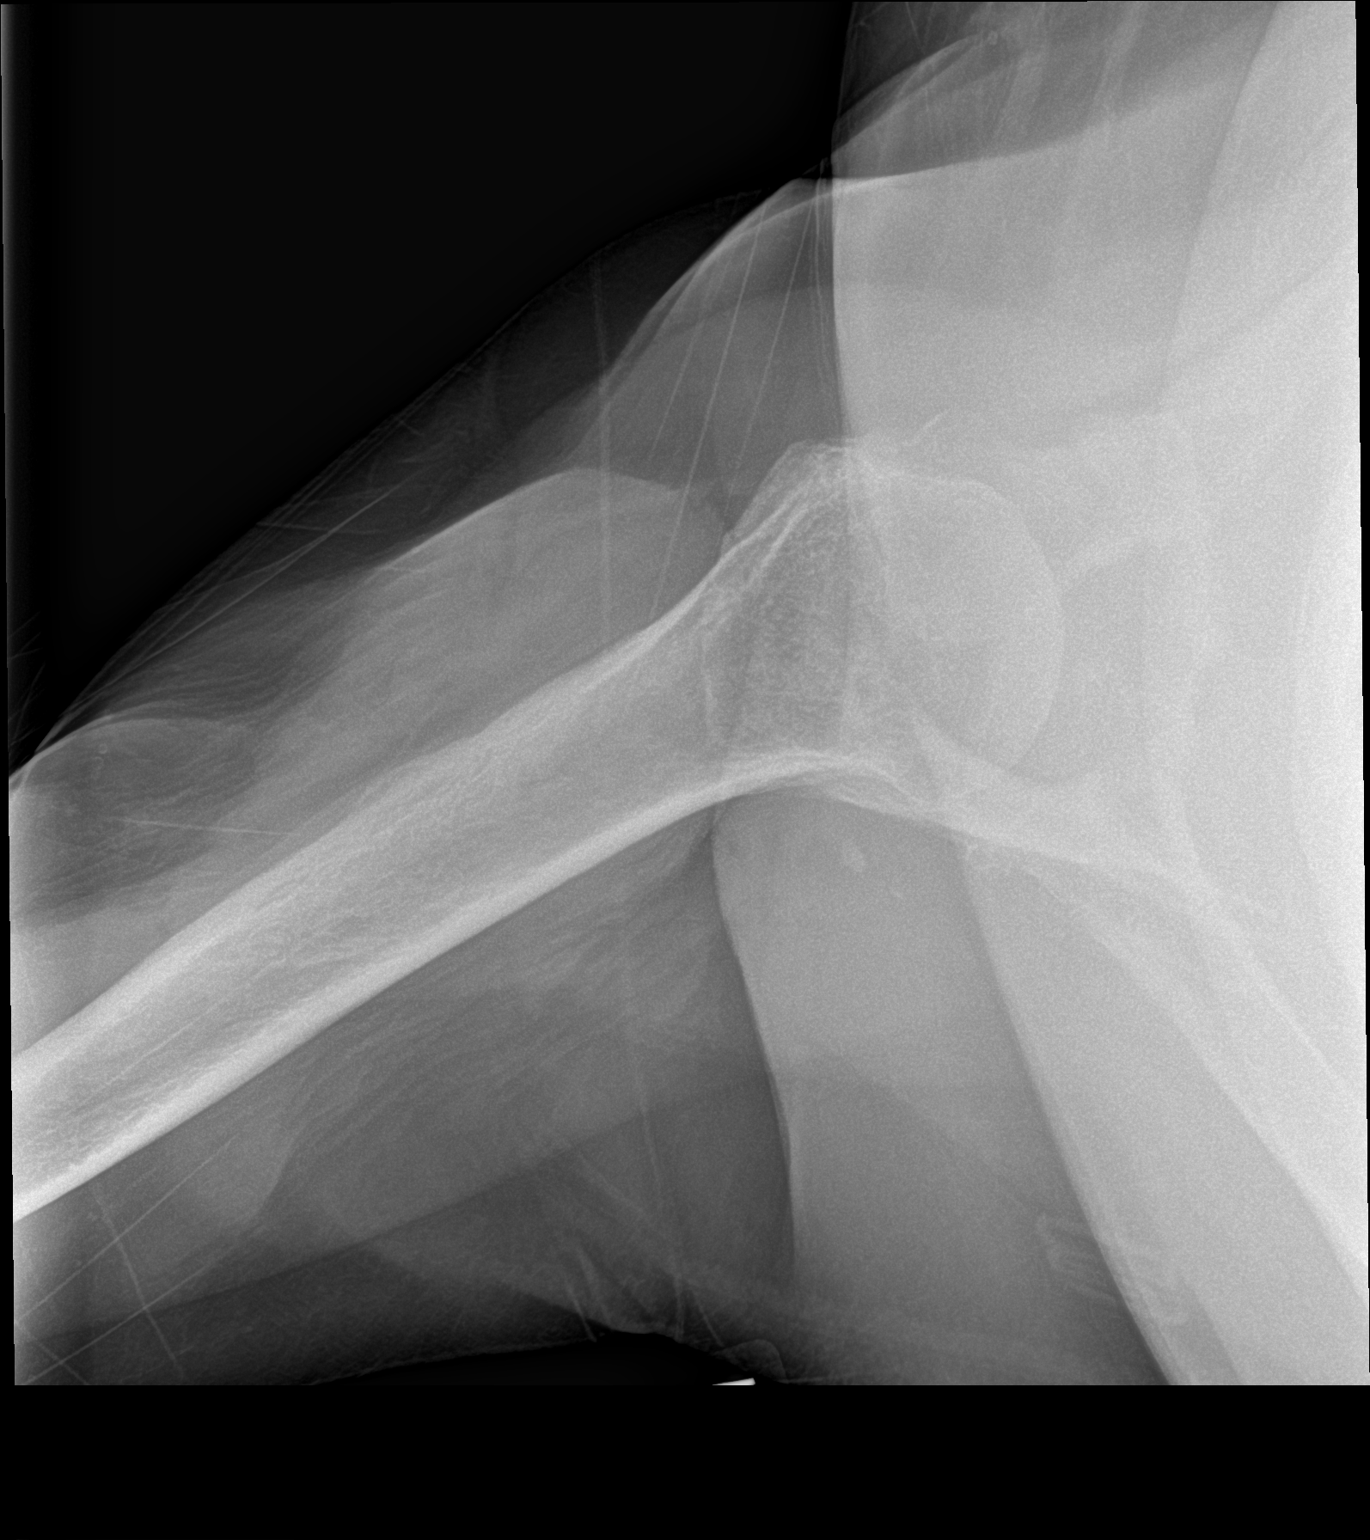

[shoulder y view]
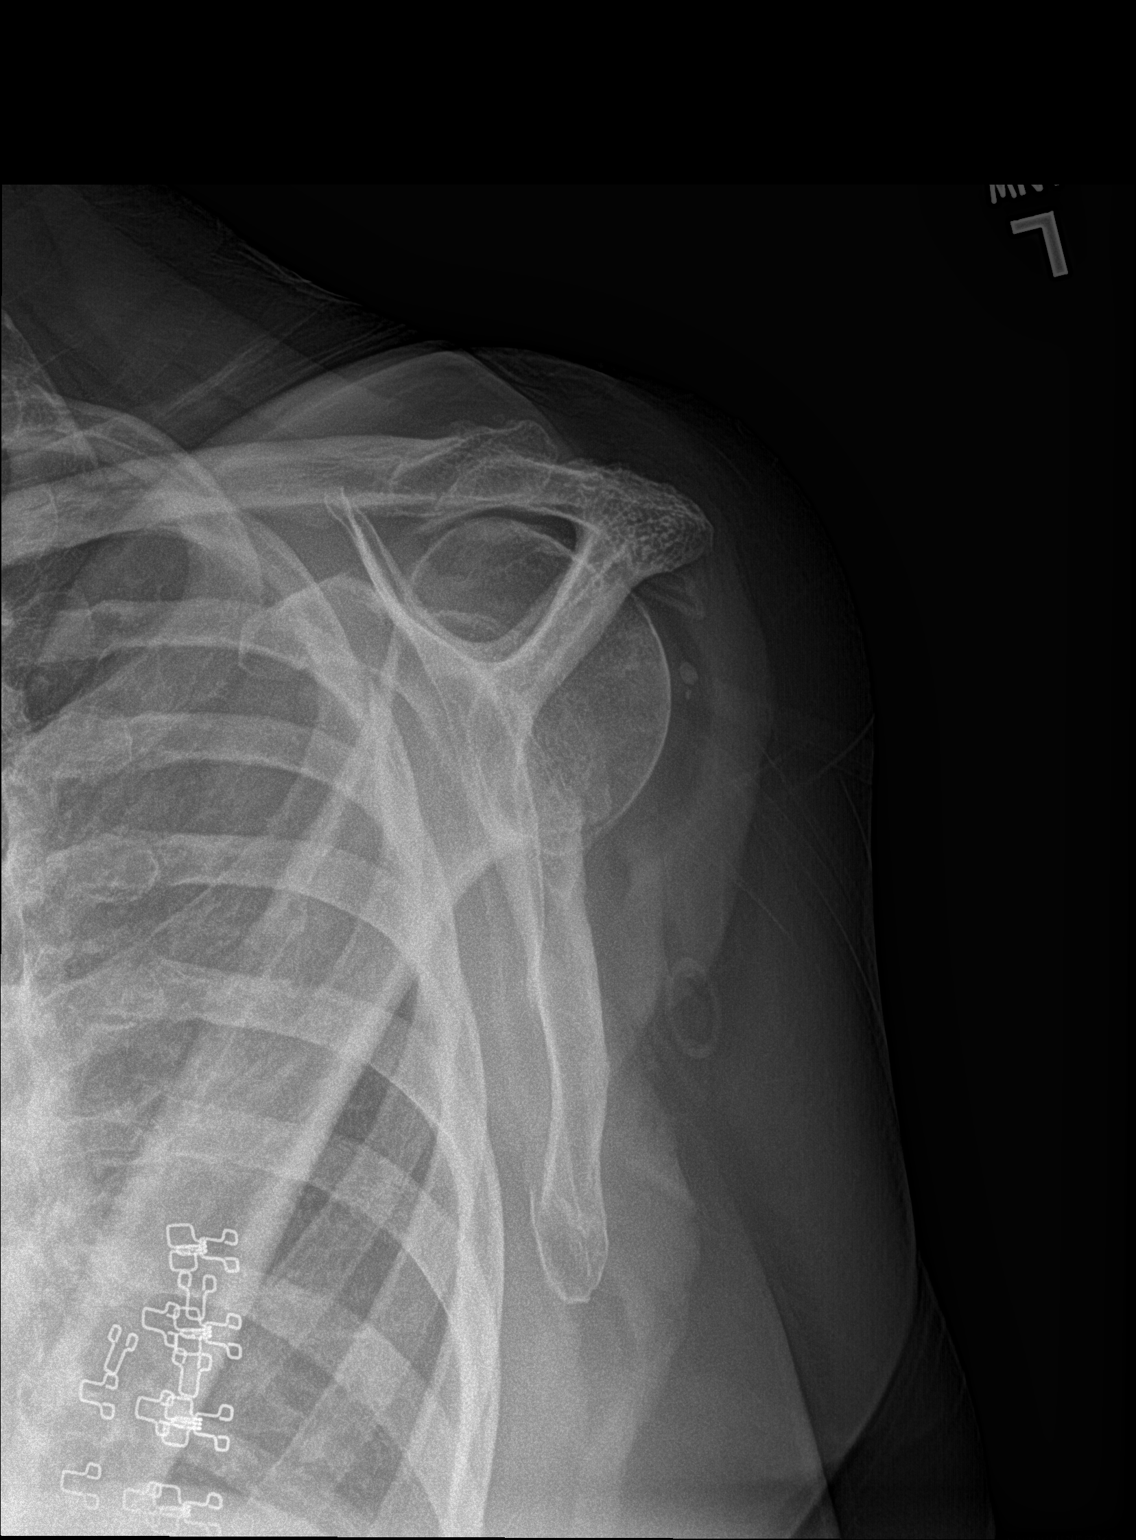

[3 of 3 positions shown; findings below may reference images not displayed]

FINDINGS: There is diffuse decreased bone mineralization. Mild glenohumeral
joint space narrowing and peripheral osteophytosis. Mild
acromioclavicular joint space narrowing and peripheral
osteophytosis. Moderate distal lateral subacromial spurring. No
acute fracture is seen. No dislocation. The visualized portion of
the left lung is unremarkable. Mild calcification within aortic
arch.
IMPRESSION: 1. Mild glenohumeral and acromioclavicular osteoarthritis.
2. Moderate distal lateral subacromial spurring.
